# Patient Record
Sex: Female | Born: 1960 | Race: White | Hispanic: No | Marital: Married | Smoking: Former smoker
Health system: Southern US, Community
[De-identification: ages and names within clinical notes are randomized; demographics above are authoritative.]

## PROBLEM LIST (undated history)

## (undated) DIAGNOSIS — R112 Nausea with vomiting, unspecified: Secondary | ICD-10-CM

## (undated) DIAGNOSIS — Z87891 Personal history of nicotine dependence: Secondary | ICD-10-CM

## (undated) DIAGNOSIS — I10 Essential (primary) hypertension: Secondary | ICD-10-CM

## (undated) DIAGNOSIS — G5602 Carpal tunnel syndrome, left upper limb: Secondary | ICD-10-CM

## (undated) HISTORY — PX: APPENDECTOMY: SHX54

## (undated) HISTORY — DX: Essential (primary) hypertension: I10

## (undated) HISTORY — PX: FOOT SURGERY: SHX648

## (undated) HISTORY — PX: TUBAL LIGATION: SHX77

---

## 2002-04-07 ENCOUNTER — Encounter: Payer: Self-pay | Admitting: Internal Medicine

## 2002-04-07 ENCOUNTER — Ambulatory Visit (HOSPITAL_COMMUNITY): Admission: RE | Admit: 2002-04-07 | Discharge: 2002-04-07 | Payer: Self-pay | Admitting: Internal Medicine

## 2002-09-01 ENCOUNTER — Ambulatory Visit (HOSPITAL_COMMUNITY): Admission: RE | Admit: 2002-09-01 | Discharge: 2002-09-01 | Payer: Self-pay | Admitting: Internal Medicine

## 2002-09-01 ENCOUNTER — Encounter: Payer: Self-pay | Admitting: Internal Medicine

## 2002-09-07 ENCOUNTER — Encounter (HOSPITAL_COMMUNITY): Admission: RE | Admit: 2002-09-07 | Discharge: 2002-10-07 | Payer: Self-pay | Admitting: Internal Medicine

## 2002-09-07 ENCOUNTER — Encounter: Payer: Self-pay | Admitting: Internal Medicine

## 2002-10-15 ENCOUNTER — Encounter: Payer: Self-pay | Admitting: Internal Medicine

## 2002-10-15 ENCOUNTER — Ambulatory Visit (HOSPITAL_COMMUNITY): Admission: RE | Admit: 2002-10-15 | Discharge: 2002-10-15 | Payer: Self-pay | Admitting: Internal Medicine

## 2003-10-14 ENCOUNTER — Ambulatory Visit (HOSPITAL_COMMUNITY): Admission: RE | Admit: 2003-10-14 | Discharge: 2003-10-14 | Payer: Self-pay | Admitting: Obstetrics & Gynecology

## 2003-12-31 ENCOUNTER — Ambulatory Visit (HOSPITAL_COMMUNITY): Admission: RE | Admit: 2003-12-31 | Discharge: 2003-12-31 | Payer: Self-pay | Admitting: Family Medicine

## 2003-12-31 ENCOUNTER — Ambulatory Visit (HOSPITAL_COMMUNITY): Admission: RE | Admit: 2003-12-31 | Discharge: 2003-12-31 | Payer: Self-pay | Admitting: Internal Medicine

## 2006-11-13 ENCOUNTER — Ambulatory Visit (HOSPITAL_COMMUNITY): Admission: RE | Admit: 2006-11-13 | Discharge: 2006-11-13 | Payer: Self-pay | Admitting: Family Medicine

## 2006-11-14 ENCOUNTER — Ambulatory Visit (HOSPITAL_COMMUNITY): Admission: RE | Admit: 2006-11-14 | Discharge: 2006-11-14 | Payer: Self-pay | Admitting: Family Medicine

## 2006-11-19 ENCOUNTER — Inpatient Hospital Stay (HOSPITAL_COMMUNITY): Admission: AD | Admit: 2006-11-19 | Discharge: 2006-11-27 | Payer: Self-pay | Admitting: Gastroenterology

## 2006-11-21 ENCOUNTER — Encounter (INDEPENDENT_AMBULATORY_CARE_PROVIDER_SITE_OTHER): Payer: Self-pay | Admitting: Gastroenterology

## 2007-08-30 ENCOUNTER — Emergency Department (HOSPITAL_COMMUNITY): Admission: EM | Admit: 2007-08-30 | Discharge: 2007-08-30 | Payer: Self-pay | Admitting: Family Medicine

## 2008-02-20 ENCOUNTER — Ambulatory Visit (HOSPITAL_COMMUNITY): Admission: RE | Admit: 2008-02-20 | Discharge: 2008-02-20 | Payer: Self-pay | Admitting: Family Medicine

## 2010-10-24 NOTE — Consult Note (Signed)
NAMENIMCO, BIVENS NO.:  [id number]   MEDICAL RECORD NO.:  [id number]          PATIENT TYPE:  INP   LOCATION:  5707                         FACILITY:  [facility]   PHYSICIAN:  Bernette Redbird, M.D.   DATE OF BIRTH:  1960-07-31   DATE OF CONSULTATION:  11/19/2006  DATE OF DISCHARGE:                                 CONSULTATION   HISTORY OF PRESENT ILLNESS:  This is a 50 year old female with a history  of hypertension, complaining of right upper quadrant pain that began  approximately 9 days ago.  She states that it radiates to the back.  She  complains of upper abdominal distention and increased eructation of gas,  as well as possible constipation from June 3 to June 7, one episode of  diarrhea on June 7 and no bowel movement since.  She has had a large  amount of vomiting Saturday and Sunday with an episode that lasted  approximately 3 hours.  During that episode, there was some hematemesis;  however, she has not vomited since and complains that she has been  unable to keep down any oral intake for the past 2 days.  No hematemesis  has been seen.  She is positive for irritable bowel syndrome symptoms  for years, alternating diarrhea and constipation.   PAST MEDICAL HISTORY:  Significant for:  1. Hypertension.  2. C-section.  3. Foot surgery.  4. D&C with endometrial ablation.  5. She has never had a colonoscopy or endoscopy.  6. She had an appendectomy in 1978.  7. Bilateral tubal ligation in 1995.  8. She has a history of a gallbladder attack, required hospitalization      approximately 5 years ago, per the patient.  Also, she had an      episode, when she was 50 years old, of inflammation in her small      intestine that caused a 1 week hospitalization.   CURRENT MEDICATIONS:  Include:  1. Benicar 20/12.5.  2. Ambien.  3. Prilosec, which was just started this past week.   REVIEW OF SYSTEMS:  Significant for history of present illness.  No  weight loss or  sick contacts.   FAMILY HISTORY:  Significant for bowel disease.  No colon cancer.  Her  mother at 62 years old with congestive heart failure.  Her father passed  away at 52 years old with lung cancer.   SOCIAL HISTORY:  Positive for tobacco, 1 pack per day.  Negative for  alcohol or drugs and no alcohol for the past 13 years.   PHYSICAL EXAMINATION:  GENERAL:  She is alert and oriented in no  apparent distress.  VITAL SIGNS:  Temperature is 97.6.  Pulse is 90.  Respirations are 18.  Blood pressure is 154/70.  HEART:  Has a regular rate and rhythm.  LUNGS:  Clear to auscultation.  ABDOMEN:  Soft, nondistended with quiet bowel sounds.  Tender to  palpation in the right upper quadrant.   On ultrasound, June 5, done at [facility], she had no  gallstones.  No pericholecystic fluid or gallbladder wall thickening.  Common bile duct  was mildly dilated at 8 mm.   ASSESSMENT:  Dr. Molly Maduro Buccini is admitting the patient.  He has seen  and examined her, collected the history.  He states that the patient has  had several days of recurring paroxysms of upper abdominal pain and  distention, partially relieved by recurrence of vomiting, which is  sometimes sporadic.  No long term symptoms.  She has had 1 episode of  diarrhea 2 days ago.  Her exam is benign.  Labs are pending, but LFTs  were normal 2 days ago at her primary care physician, Dr. Sharyon Medicus  office.  Ultrasound, at [facility], showed no gallstones.  An 8 mm  common bile duct.   IMPRESSION:  Unclear picture.  Differential diagnoses includes  gastroenteritis, a calculus biliary tract disease, intermittent small  bowel obstruction, questionable peptic ulcer disease, gout, common bile  duct pathology given normal LFTs.   PLAN:  Draw labs.  Recheck ultrasound.  CPK with HIDA scan in the  morning.  Pending those results, possibly an EGD or a CAT scan of her  abdomen and pelvis.  For now, we will give antinausea therapy and   analgesics.      Stephani Police, PA    ______________________________  Bernette Redbird, M.D.    MLY/MEDQ  D:  11/19/2006  T:  11/20/2006  Job:  [id number]   cc:   Madelin Rear. Sherwood Gambler, MD

## 2010-10-24 NOTE — Op Note (Signed)
Joy Alvarado, Joy Alvarado                 ACCOUNT NO.:  [id number]   MEDICAL RECORD NO.:  [id number]          PATIENT TYPE:  INP   LOCATION:  5707                         FACILITY:  [facility]   PHYSICIAN:  Bernette Redbird, M.D.   DATE OF BIRTH:  January 24, 1961   DATE OF PROCEDURE:  11/21/2006  DATE OF DISCHARGE:                               OPERATIVE REPORT   PROCEDURE:  Upper endoscopy with biopsies.   ENDOSCOPIST:  Bernette Redbird, M.D.   INDICATIONS:  [age]-year-old with a several-day history of upper  abdominal pain, nausea and vomiting with negative ultrasound, CCK  hepatobiliary scan, and abdominal CT.   FINDINGS:  Normal exam other than small hiatal hernia.   PROCEDURE:  The nature, purpose and risks of the procedure had been  discussed with the patient, who provided written consent.  She was  brought in a fasted state from her hospital room to the endoscopy unit,  where she was sedated with fentanyl 75 mcg and Versed 5 mg IV, without  arrhythmias or desaturation.  The Pentax video endoscope was passed  under direct vision.  The larynx looked grossly normal.  The esophagus  was also normal and was quite easily entered.  The mucosa was normal,  there was no evidence of free reflux, reflux esophagitis, Barrett's  esophagus, varices, infection, neoplasia, or any ring or stricture.  A  small hiatal hernia was present.  The stomach contained no significant  residual and had normal mucosa without evidence of gastritis, erosions,  ulcers, polyps or masses including retroflexed view of the cardia, and  the pylorus, duodenal bulb and second duodenum looked completely normal.  Random mucosal biopsies were obtained of the duodenum and gastric  antrum, in view of the unexplained symptoms, and the scope was then  removed from the patient, who tolerated the procedure well and without  apparent complication.   IMPRESSION:  Right upper quadrant abdominal pain without obvious  explanation on  current examination (789.01).   PLAN:  Await pathology results.           ______________________________  Bernette Redbird, M.D.     RB/MEDQ  D:  11/21/2006  T:  11/21/2006  Job:  [id number]   cc:   Madelin Rear. Sherwood Gambler, MD

## 2010-10-24 NOTE — Discharge Summary (Signed)
NAMEAUDRIE, Joy Alvarado                 ACCOUNT NO.:  [id number]   MEDICAL RECORD NO.:  [id number]          PATIENT TYPE:  INP   LOCATION:  5707                         FACILITY:  [facility]   PHYSICIAN:  Graylin Shiver, M.D.   DATE OF BIRTH:  14-Jan-1961   DATE OF ADMISSION:  11/19/2006  DATE OF DISCHARGE:  11/27/2006                               DISCHARGE SUMMARY   ADMISSION DIAGNOSES:  1. Recurrent vomiting.  2. Constipation.  3. Hypertension.   DISCHARGE DIAGNOSES:  1. Idiopathic gastroparesis.  2. Irritable bowel syndrome.  3. Hypertension.   SERVICE:  Gastroenterology.   CONSULTANT:  None.   PROCEDURES:  November 21, 2006, upper endoscopy with biopsies by Dr. Bernette Redbird.   DIAGNOSTICS:  1. Abdominal ultrasound June 10 showed:      a.     Gallbladder polyps.      b.     Prominent common bile duct at 7 mm.      c.     Probable mild fatty infiltration of the liver.  2. Diagnostic nuclear medicine hepatobiliary scan with ejection      fraction.  Impression was normal examination gallbladder ejection      fraction of 56%.  3. Diagnostic CT scan of the abdomen and pelvis.  Impression was:      a.     Constipation      b.     Right ovarian cyst.  4. Diagnostic upper GI series with small-bowel follow-through.      Impression was:      a.     Negative negative small bowel follow-through.      b.     Small hiatal hernia.      c.     No esophageal reflex was elicited on examination, esophageal       motility is normal.  5. June 17, a two-view abdominal x-ray.  Impression:  Distal colonic      disease is considered.  This x-ray notes that an air column within      the distal sigmoid is narrowed as was seen on previous CT scan.  6. Diagnostic nuclear medicine gastric emptying study.  Impression:      Delayed gastric emptying at 2 hours.  There is 85% remaining      activity in the stomach, normally there is less than 30%   BRIEF HISTORY:  This is a 50 year old female with history  of  hypertension who was complaining of right upper quadrant pain that began  approximately 9 days prior to admission.  She said that it did radiate  to her back.  She complains of upper abdominal distension and increased  eructation as well as possible constipation intermixed with episodes of  diarrhea.  She had a large amount of vomiting prior to admission.  There  was a minimal amount of hematemesis, and she reported that she was  unable to keep any oral intake down for 2 days prior to admission.  She  states that she has had alternating constipation and diarrhea for  several years.   PAST MEDICAL  HISTORY:  1. Hypertension.  2. C section  3. Foot surgery.  4. D&C with endometrial ablation.  5. Appendectomy in 1978.  6. Bilateral tubal ligation in 1995.  7. She states she has had a history of a gallbladder attack that      required hospitalization approximately 5 years ago.  8. She states that when she was 50 years old, she had an episode of      inflammation of her small intestine the caused a one week      hospitalization.  The patient reports that she has never had a      colonoscopy.   HOSPITAL COURSE:  The patient was admitted, placed on clear liquid diet.  Labs were checked.  She had a complete __________ with no villous  atrophy inflammation or other abnormalities.  She did have a full  gastroenterological workup which included an EGD.  The EGD results were  essentially normal.  Pathology from the EGD was positive for H pylori  for which the patient was treated.  She was also found to have a  positive gastric emptying scan with 85% retention of contents after 2  hours.  On the day of discharge, the patient was in good condition, able  to tolerate clear liquids without vomiting.  She was requesting to go  home on physical exam.  She was alert and oriented and in no apparent  distress.  She was not jaundiced, showed no signs of icterus or pallor.  Her abdomen was soft,  nontender, nondistended with good bowel sounds.  The patient was discharged to home at her request.   DISCHARGE MEDICATIONS:  1. Benicar HCT 20/12.51 daily.  2. Prevpac b.i.d. times 10 days.  3. Metoclopramide 10 mg p.o. 1/2 hour before meals and at bedtime.  4. Potassium 10 mEq one a day for 7 days.  5. The patient was already on Prilosec at home.  She will continue      that.   FOLLOWUP:  We scheduled a follow-up appointment for her to see Dr.  Matthias Hughs on June 25 at 2:45 p.m. Items to be addressed in the follow-up  appointments include:  1. Gastroparesis and recurrent vomiting.  2. Symptoms of gastroesophageal reflux disease.  3. Symptoms of constipation/diarrhea.  4. Possible colonic abnormalities seen on x-ray during her hospital      stay.   DISCHARGE INSTRUCTIONS:  1. Clear liquid diet until symptoms improve.  2. Ensure supplements.  3. Please call our office if symptoms worsen  4. Gastroparesis diet including small meals, the elimination of      cellulose.      Joy Police, PA    ______________________________  Graylin Shiver, M.D.    MLY/MEDQ  D:  11/27/2006  T:  11/28/2006  Job:  [id number]   cc:   Madelin Rear. Sherwood Gambler, MD  Bernette Redbird, M.D.

## 2010-10-27 NOTE — Op Note (Signed)
NAME:  Joy Alvarado, HANNAN                           ACCOUNT NO.:  [id number]   MEDICAL RECORD NO.:  [id number]                   PATIENT TYPE:  AMB   LOCATION:  DAY                                  FACILITY:  [facility]   PHYSICIAN:  Lazaro Arms, M.D.                DATE OF BIRTH:  04-Jul-1960   DATE OF PROCEDURE:  10/14/2003  DATE OF DISCHARGE:  10/14/2003                                 OPERATIVE REPORT   PREOPERATIVE DIAGNOSES:  1. Menometrorrhagia.  2. Dysmenorrhea.  3. Dysfunctional uterine bleeding.   POSTOPERATIVE DIAGNOSES:  1. Menometrorrhagia.  2. Dysmenorrhea.  3. Dysfunctional uterine bleeding.  4. Submucosal myoma.   SURGEON:  Lazaro Arms, M.D.   ANESTHESIA:  General endotracheal.   FINDINGS:  The patient had a normal endometrium and a small rubbery area  consistent with a very small myoma.  Endometrial curettings were all removed  and sent to pathology.  There was minimal bleeding.   DESCRIPTION OF PROCEDURE:  The patient was taken to the operating room and  placed in the supine position where she underwent general endotracheal  anesthesia.  She was placed in the dorsal lithotomy position and prepped and  draped in the usual sterile fashion.  The bladder was drained.  The cervix  was grasped and dilated serially to allow passage of the hysteroscope.  Hysteroscopy was performed using normal saline as the distending media.  The  patient tolerated the procedure well.  At this point, there was a normal  endometrium, as stated above.  Uterine curettage was performed, with all  fragments sent to pathology.  Endometrial ablation was then performed using  D5W as the distending media to obtain an adequate pressure.  It was heated  to 87 degrees Celsius for a total therapy time of 10 and a half minutes.  The patient tolerated the procedure well.  All fluid was recovered at the  end of the procedure.  She was awakened from anesthesia and taken to the  recovery room doing  well.      ___________________________________________                                            Lazaro Arms, M.D.   LHE/MEDQ  D:  10/26/2003  T:  10/26/2003  Job:  [id number]

## 2010-10-27 NOTE — H&P (Signed)
NAME:  MIKAIAH, STOFFER                           ACCOUNT NO.:  [id number]   MEDICAL RECORD NO.:  [id number]                   PATIENT TYPE:  AMB   LOCATION:  DAY                                  FACILITY:  [facility]   PHYSICIAN:  Lazaro Arms, M.D.                DATE OF BIRTH:  [birth year]   DATE OF ADMISSION:  DATE OF DISCHARGE:                                HISTORY & PHYSICAL   HISTORY OF PRESENT ILLNESS:  Joy Alvarado is a 50 year old white female, gravida  3, para 3, who is status post tubal ligation in 1995 and presents with a  relatively long history of heavy vaginal bleeding monthly.  She started her  last period on March 28th and bleed through September 29, 2003.  Her bleeding at  the time was quite heavy with heavy cramping.  Her hemoglobin in the office  was 12.1.  Examination in the office revealed normal-sized uterus.  As a  result because of her prolonged persistent vaginal bleeding she is admitted  for definitive therapy and endometrial ablation.  She was placed on Megace  in the interim, which has stopped her bleeding.   PAST MEDICAL HISTORY:  The patient has hypertension.   PAST SURGICAL HISTORY:  1. The patient had an appendectomy in 1978.  2. Tubal ligation in 1995.   PAST OBSTETRICAL HISTORY:  Three vaginal deliveries.   MEDICATIONS:  Diovan.   ALLERGIES:  None.   REVIEW OF SYSTEMS:  Review of systems is otherwise negative.   SOCIAL HISTORY:  The patient does smoke cigarettes.  She does do drugs or  drink.   PHYSICAL EXAMINATION:  VITAL SIGNS:  Weight is 176 pounds.  Blood pressure  120/90.  HEENT AND NECK:  The HEENT is unremarkable and thyroid is normal.  LUNGS:  Lungs are clear.  HEART:  Heart is regular rate and rhythm without murmur, regurg or gallop.  BREASTS:  Breasts are without discharge, mass or skin changes.  ABDOMEN:  Abdomen is benign with no hepatosplenomegaly or masses.  PELVIC EXAMINATION:  The patient has normal external genitalia.  The vagina  is  pink, moist  with no discharge.  The cervix is parous without lesion.  The uterus is normal size and nontender.  The adnexa is nontender.  EXTREMITIES:  Extremities are warm without edema.  NEUROLOGIC:  Neurological exam is grossly intact.   IMPRESSION:  1. Menometrorrhagia.  2. Dysmenorrhea.  3. Dysfunction uterine bleeding.   PLAN:  The patient is admitted for hysteroscopy and endometrial ablation.  She understands the risks, benefits, indication, and alternatives and will  proceed.     ___________________________________________                                         Lazaro Arms, M.D.   Loraine Maple  D:  10/13/2003  T:  10/14/2003  Job:  [id number]

## 2011-03-05 LAB — POCT URINALYSIS DIP (DEVICE)
Glucose, UA: NEGATIVE
Hgb urine dipstick: NEGATIVE
Nitrite: NEGATIVE
Operator id: 239701
Protein, ur: 30 — AB
Specific Gravity, Urine: 1.02
Urobilinogen, UA: 1
pH: 6.5

## 2011-03-28 LAB — CBC
HCT: 36.6
Hemoglobin: 12.6
MCHC: 34.5
MCV: 87.2
Platelets: 180
RBC: 4.19
RDW: 12.8
WBC: 6.8

## 2011-03-28 LAB — COMPREHENSIVE METABOLIC PANEL
ALT: 11
AST: 13
Albumin: 3.1 — ABNORMAL LOW
Alkaline Phosphatase: 62
BUN: 3 — ABNORMAL LOW
CO2: 24
Calcium: 8.4
Chloride: 108
Creatinine, Ser: 0.71
GFR calc Af Amer: >60
GFR calc non Af Amer: >60
Glucose, Bld: 104 — ABNORMAL HIGH
Potassium: 3.4 — ABNORMAL LOW
Sodium: 139
Total Bilirubin: 0.5
Total Protein: 5.5 — ABNORMAL LOW

## 2011-03-28 LAB — AMYLASE: Amylase: 33

## 2011-03-29 LAB — CBC
HCT: 40.2
HCT: 41.7
Hemoglobin: 13.9
Hemoglobin: 14.5
MCHC: 34.6
MCHC: 34.7
MCV: 88.4
MCV: 88.9
Platelets: 197
Platelets: 212
RBC: 4.52
RBC: 4.72
RDW: 12.8
RDW: 13
WBC: 10.6 — ABNORMAL HIGH
WBC: 7.1

## 2011-03-29 LAB — COMPREHENSIVE METABOLIC PANEL
ALT: 12
ALT: 13
AST: 12
AST: 13
Albumin: 3.3 — ABNORMAL LOW
Albumin: 3.8
Alkaline Phosphatase: 66
Alkaline Phosphatase: 67
BUN: 6
BUN: 7
CO2: 26
CO2: 27
Calcium: 8.5
Calcium: 9.1
Chloride: 105
Chloride: 108
Creatinine, Ser: 0.7
Creatinine, Ser: 0.74
GFR calc Af Amer: >60
GFR calc Af Amer: >60
GFR calc non Af Amer: >60
GFR calc non Af Amer: >60
Glucose, Bld: 92
Glucose, Bld: 94
Potassium: 3.6
Potassium: 3.9
Sodium: 138
Sodium: 138
Total Bilirubin: 0.4
Total Bilirubin: 0.7
Total Protein: 5.9 — ABNORMAL LOW
Total Protein: 6.6

## 2011-03-29 LAB — BASIC METABOLIC PANEL
BUN: 5 — ABNORMAL LOW
CO2: 27
Calcium: 8.8
Chloride: 105
Creatinine, Ser: 0.71
GFR calc Af Amer: >60
GFR calc non Af Amer: >60
Glucose, Bld: 101 — ABNORMAL HIGH
Potassium: 3.3 — ABNORMAL LOW
Sodium: 138

## 2011-03-29 LAB — LIPASE, BLOOD: Lipase: 19

## 2011-03-29 LAB — AMYLASE: Amylase: 45

## 2011-04-20 ENCOUNTER — Other Ambulatory Visit (HOSPITAL_COMMUNITY): Payer: Self-pay | Admitting: Internal Medicine

## 2011-04-20 DIAGNOSIS — Z139 Encounter for screening, unspecified: Secondary | ICD-10-CM

## 2011-04-26 ENCOUNTER — Ambulatory Visit (HOSPITAL_COMMUNITY)
Admission: RE | Admit: 2011-04-26 | Discharge: 2011-04-26 | Disposition: A | Discharge status: Home / Self Care | Payer: BC Managed Care – PPO | Source: Ambulatory Visit | Attending: Internal Medicine | Admitting: Internal Medicine

## 2011-04-26 DIAGNOSIS — Z1231 Encounter for screening mammogram for malignant neoplasm of breast: Secondary | ICD-10-CM | POA: Insufficient documentation

## 2011-04-26 DIAGNOSIS — Z139 Encounter for screening, unspecified: Secondary | ICD-10-CM

## 2014-02-03 ENCOUNTER — Other Ambulatory Visit (HOSPITAL_COMMUNITY): Payer: Self-pay | Admitting: Internal Medicine

## 2014-02-03 DIAGNOSIS — Z139 Encounter for screening, unspecified: Secondary | ICD-10-CM

## 2014-02-08 ENCOUNTER — Ambulatory Visit (HOSPITAL_COMMUNITY)
Admission: RE | Admit: 2014-02-08 | Discharge: 2014-02-08 | Disposition: A | Discharge status: Home / Self Care | Payer: PRIVATE HEALTH INSURANCE | Source: Ambulatory Visit | Attending: Internal Medicine | Admitting: Internal Medicine

## 2014-02-08 DIAGNOSIS — Z139 Encounter for screening, unspecified: Secondary | ICD-10-CM | POA: Diagnosis not present

## 2015-04-18 ENCOUNTER — Encounter: Payer: Self-pay | Admitting: Podiatry

## 2015-04-18 ENCOUNTER — Ambulatory Visit (INDEPENDENT_AMBULATORY_CARE_PROVIDER_SITE_OTHER): Payer: BLUE CROSS/BLUE SHIELD

## 2015-04-18 ENCOUNTER — Ambulatory Visit (INDEPENDENT_AMBULATORY_CARE_PROVIDER_SITE_OTHER): Payer: BLUE CROSS/BLUE SHIELD | Admitting: Podiatry

## 2015-04-18 VITALS — BP 157/101 | HR 79 | Resp 12

## 2015-04-18 DIAGNOSIS — R52 Pain, unspecified: Secondary | ICD-10-CM | POA: Diagnosis not present

## 2015-04-18 DIAGNOSIS — Q828 Other specified congenital malformations of skin: Secondary | ICD-10-CM | POA: Diagnosis not present

## 2015-04-18 DIAGNOSIS — L84 Corns and callosities: Secondary | ICD-10-CM

## 2015-04-18 NOTE — Progress Notes (Signed)
   Subjective:    Patient ID: Joy Alvarado, female    DOB: 1961-02-19, 54 y.o.   MRN: [id number]  HPIThis patient presents to the office with painful skin lesions on both feet.  She says she has a new job which she believes has led to callus on big toe left foot.  She has tried padding but problematic callus continues.  She also has painful skin lesion under the ball of her right foot.  Both problems have continued to worsen.  She has previously had surgery for removal 1,2,3 toenails both feet which have healed well.  She presents for evaluation and treatment.  B/L ball of feet pain  Review of Systems  All other systems reviewed and are negative.      Objective:   Physical Exam GENERAL APPEARANCE: Alert, conversant. Appropriately groomed. No acute distress.  VASCULAR: Pedal pulses palpable at  [facility] and PT bilateral.  Capillary refill time is immediate to all digits,  Normal temperature gradient.  Digital hair growth is present bilateral  NEUROLOGIC: sensation is normal to 5.07 monofilament at 5/5 sites bilateral.  Light touch is intact bilateral, Muscle strength normal.  MUSCULOSKELETAL: acceptable muscle strength, tone and stability bilateral.  Intrinsic muscluature intact bilateral.  Rectus appearance of foot and digits noted bilateral. Significant guarding IPJ left hallux at IPJ left hallux.  DERMATOLOGIC: skin color, texture, and turgor are within normal limits.  No preulcerative lesions or ulcers  are seen, no interdigital maceration noted.  No open lesions present.  Digital nails are asymptomatic 4,5 B/L.  Porokeratosis sub 1 right foot.  Diffuse plantar tyloma under IPJ left foot  Hallux..         Assessment & Plan:  Porokeratosis right foot  Callus left hallux.  ROV.  Discussed conservative vs. Surgical treatment right foot.  X-ray reveal no pathology.  Told her her callus is job related and all I can offer her is padding.  To acquire padding for her hallux.

## 2015-04-21 ENCOUNTER — Ambulatory Visit (INDEPENDENT_AMBULATORY_CARE_PROVIDER_SITE_OTHER): Payer: BLUE CROSS/BLUE SHIELD | Admitting: Podiatry

## 2015-04-21 ENCOUNTER — Encounter: Payer: Self-pay | Admitting: Podiatry

## 2015-04-21 DIAGNOSIS — Q828 Other specified congenital malformations of skin: Secondary | ICD-10-CM

## 2015-04-21 DIAGNOSIS — M2580 Other specified joint disorders, unspecified joint: Secondary | ICD-10-CM

## 2015-04-21 NOTE — Progress Notes (Signed)
Subjective:     Patient ID: Joy Alvarado, female   DOB: [birth year], 54 y.o.   MRN: [id number]  HPI this patient presents to the office with chief complaint of painful calluses, especially under the ball of her right foot. She was seen and asked for these calluses which were then debrided. She states since that visit, right foot has been in intense pain and she desires to have the surgery for the removal of skin lesion that was discussed at that Monday visit. She presents to the office for removal of skin lesion under the plate toe joint of the right foot. She appears to have pain out of proportion to what is seen clinically under the big toe joint of the right foot   Review of Systems     Objective:   Physical Exam Physical Exam GENERAL APPEARANCE: Alert, conversant. Appropriately groomed. No acute distress.  VASCULAR: Pedal pulses palpable at [facility] and PT bilateral. Capillary refill time is immediate to all digits, Normal temperature gradient. Digital hair growth is present bilateral  NEUROLOGIC: sensation is normal to 5.07 monofilament at 5/5 sites bilateral. Light touch is intact bilateral, Muscle strength normal.  MUSCULOSKELETAL: acceptable muscle strength, tone and stability bilateral. Intrinsic muscluature intact bilateral. Rectus appearance of foot and digits noted bilateral. Significant guarding IPJ left hallux at IPJ left hallux.. Palpable pain fibular sasamoid right foot.  DERMATOLOGIC: skin color, texture, and turgor are within normal limits. No preulcerative lesions or ulcers are seen, no interdigital maceration noted. No open lesions present. Digital nails are asymptomatic 4,5 B/L. Porokeratosis sub 1 right foot. Diffuse plantar tyloma under IPJ left foot Hallux     Assessment:     Porokeratosis Right Foot   Sesamoiditis right foot.     Plan:     ROV  Excision of skin lesion.This patient was anesthetized with mixture of 2% lidocaine plain and 2 % lidocaine with epi.  at the site of the skin lesion.  The surgical site was then washed with betadine and alcohol.  Using a punch the lesion was excised and passed off as specimen.  The surgical site was cauterized with phenol and washed with alcohol.  The site was bandaged with neosporin, sterile 2x2 and kling.  Home instructions given.  She was infiltrated with cortisone fibular sesamoid right foot. RTC 5 days.  Gardiner Barefoot DPM

## 2015-04-25 ENCOUNTER — Encounter: Payer: Self-pay | Admitting: Podiatry

## 2015-04-25 ENCOUNTER — Ambulatory Visit (INDEPENDENT_AMBULATORY_CARE_PROVIDER_SITE_OTHER): Payer: BLUE CROSS/BLUE SHIELD | Admitting: Podiatry

## 2015-04-25 VITALS — BP 128/66 | HR 60 | Resp 12

## 2015-04-25 DIAGNOSIS — Z09 Encounter for follow-up examination after completed treatment for conditions other than malignant neoplasm: Secondary | ICD-10-CM

## 2015-04-25 NOTE — Progress Notes (Signed)
Subjective:     Patient ID: Joy Alvarado, female   DOB: Oct 19, 1960, 53 y.o.   MRN: [id number]  HPIThis patient presents tom the office with continued pain 5 days after excision porokeratosis right foot.  There is no evidence of redness or swelling.  She has difficulty walking on her foot.   Review of Systems     Objective:   Physical Exam GENERAL APPEARANCE: Alert, conversant. Appropriately groomed. No acute distress.  VASCULAR: Pedal pulses palpable at  [facility] and PT bilateral.  Capillary refill time is immediate to all digits,  Normal temperature gradient.  Digital hair growth is present bilateral  NEUROLOGIC: sensation is normal to 5.07 monofilament at 5/5 sites bilateral.  Light touch is intact bilateral, Muscle strength normal.  MUSCULOSKELETAL: acceptable muscle strength, tone and stability bilateral.  Intrinsic muscluature intact bilateral.  Rectus appearance of foot and digits noted bilateral.   DERMATOLOGIC: skin color, texture, and turgor are within normal limits.  No preulcerative lesions or ulcers  are seen, no interdigital maceration noted.  No open lesions present.  Digital nails are asymptomatic. No drainage noted. Porokeratosis is healing with no redness swelling or infection.      Assessment:     S/p skin surgery     Plan:     ROV  Dispersion padding applied.  RTC prn   Gardiner Barefoot DPM

## 2015-04-27 DIAGNOSIS — M79673 Pain in unspecified foot: Secondary | ICD-10-CM

## 2015-05-12 ENCOUNTER — Telehealth: Payer: Self-pay | Admitting: *Deleted

## 2015-05-12 NOTE — Telephone Encounter (Signed)
[facility] lab form sent to our office without type of specimen or location, dx or doctor signature.  I faxed to the [facility]  site for clarification.

## 2015-05-23 ENCOUNTER — Encounter: Payer: Self-pay | Admitting: Podiatry

## 2016-04-09 DIAGNOSIS — E663 Overweight: Secondary | ICD-10-CM | POA: Diagnosis not present

## 2016-04-09 DIAGNOSIS — Z23 Encounter for immunization: Secondary | ICD-10-CM | POA: Diagnosis not present

## 2016-04-09 DIAGNOSIS — I10 Essential (primary) hypertension: Secondary | ICD-10-CM | POA: Diagnosis not present

## 2016-04-09 DIAGNOSIS — Z6827 Body mass index (BMI) 27.0-27.9, adult: Secondary | ICD-10-CM | POA: Diagnosis not present

## 2016-04-09 DIAGNOSIS — G894 Chronic pain syndrome: Secondary | ICD-10-CM | POA: Diagnosis not present

## 2016-04-09 DIAGNOSIS — Z1389 Encounter for screening for other disorder: Secondary | ICD-10-CM | POA: Diagnosis not present

## 2016-05-28 DIAGNOSIS — I10 Essential (primary) hypertension: Secondary | ICD-10-CM | POA: Diagnosis not present

## 2016-05-28 DIAGNOSIS — Z6827 Body mass index (BMI) 27.0-27.9, adult: Secondary | ICD-10-CM | POA: Diagnosis not present

## 2016-05-28 DIAGNOSIS — E663 Overweight: Secondary | ICD-10-CM | POA: Diagnosis not present

## 2016-05-28 DIAGNOSIS — Z0001 Encounter for general adult medical examination with abnormal findings: Secondary | ICD-10-CM | POA: Diagnosis not present

## 2016-05-28 DIAGNOSIS — Z1389 Encounter for screening for other disorder: Secondary | ICD-10-CM | POA: Diagnosis not present

## 2016-05-28 DIAGNOSIS — G894 Chronic pain syndrome: Secondary | ICD-10-CM | POA: Diagnosis not present

## 2016-05-28 DIAGNOSIS — G2581 Restless legs syndrome: Secondary | ICD-10-CM | POA: Diagnosis not present

## 2016-05-29 ENCOUNTER — Other Ambulatory Visit (HOSPITAL_COMMUNITY): Payer: Self-pay | Admitting: Internal Medicine

## 2016-05-29 DIAGNOSIS — Z1231 Encounter for screening mammogram for malignant neoplasm of breast: Secondary | ICD-10-CM

## 2016-06-14 ENCOUNTER — Ambulatory Visit (HOSPITAL_COMMUNITY)
Admission: RE | Admit: 2016-06-14 | Discharge: 2016-06-14 | Disposition: A | Discharge status: Home / Self Care | Payer: BLUE CROSS/BLUE SHIELD | Source: Ambulatory Visit | Attending: Internal Medicine | Admitting: Internal Medicine

## 2016-06-14 DIAGNOSIS — Z1231 Encounter for screening mammogram for malignant neoplasm of breast: Secondary | ICD-10-CM | POA: Insufficient documentation

## 2016-11-09 ENCOUNTER — Emergency Department (HOSPITAL_COMMUNITY): Payer: BLUE CROSS/BLUE SHIELD

## 2016-11-09 ENCOUNTER — Encounter (HOSPITAL_COMMUNITY): Payer: Self-pay

## 2016-11-09 ENCOUNTER — Emergency Department (HOSPITAL_COMMUNITY)
Admission: EM | Admit: 2016-11-09 | Discharge: 2016-11-09 | Disposition: A | Discharge status: Home / Self Care | Payer: BLUE CROSS/BLUE SHIELD | Attending: Emergency Medicine | Admitting: Emergency Medicine

## 2016-11-09 DIAGNOSIS — R42 Dizziness and giddiness: Secondary | ICD-10-CM | POA: Diagnosis not present

## 2016-11-09 DIAGNOSIS — R55 Syncope and collapse: Secondary | ICD-10-CM | POA: Diagnosis not present

## 2016-11-09 DIAGNOSIS — R9431 Abnormal electrocardiogram [ECG] [EKG]: Secondary | ICD-10-CM | POA: Insufficient documentation

## 2016-11-09 DIAGNOSIS — F17210 Nicotine dependence, cigarettes, uncomplicated: Secondary | ICD-10-CM | POA: Diagnosis not present

## 2016-11-09 DIAGNOSIS — Z1389 Encounter for screening for other disorder: Secondary | ICD-10-CM | POA: Diagnosis not present

## 2016-11-09 DIAGNOSIS — Z6826 Body mass index (BMI) 26.0-26.9, adult: Secondary | ICD-10-CM | POA: Diagnosis not present

## 2016-11-09 DIAGNOSIS — Z79899 Other long term (current) drug therapy: Secondary | ICD-10-CM | POA: Diagnosis not present

## 2016-11-09 DIAGNOSIS — I10 Essential (primary) hypertension: Secondary | ICD-10-CM | POA: Insufficient documentation

## 2016-11-09 DIAGNOSIS — I951 Orthostatic hypotension: Secondary | ICD-10-CM | POA: Diagnosis not present

## 2016-11-09 LAB — URINALYSIS, ROUTINE W REFLEX MICROSCOPIC
Bilirubin Urine: NEGATIVE
Glucose, UA: NEGATIVE mg/dL
Ketones, ur: NEGATIVE mg/dL
Nitrite: NEGATIVE
Protein, ur: NEGATIVE mg/dL
Specific Gravity, Urine: 1.009 (ref 1.005–1.030)
pH: 7 (ref 5.0–8.0)

## 2016-11-09 LAB — CBC WITH DIFFERENTIAL/PLATELET
Basophils Absolute: 0 10*3/uL (ref 0.0–0.1)
Basophils Relative: 0 %
Eosinophils Absolute: 0.1 10*3/uL (ref 0.0–0.7)
Eosinophils Relative: 2 %
HCT: 40.3 % (ref 36.0–46.0)
Hemoglobin: 13.9 g/dL (ref 12.0–15.0)
Lymphocytes Relative: 46 %
Lymphs Abs: 3.3 10*3/uL (ref 0.7–4.0)
MCH: 30.7 pg (ref 26.0–34.0)
MCHC: 34.5 g/dL (ref 30.0–36.0)
MCV: 89 fL (ref 78.0–100.0)
Monocytes Absolute: 0.7 10*3/uL (ref 0.1–1.0)
Monocytes Relative: 9 %
Neutro Abs: 3.1 10*3/uL (ref 1.7–7.7)
Neutrophils Relative %: 43 %
Platelets: 191 10*3/uL (ref 150–400)
RBC: 4.53 MIL/uL (ref 3.87–5.11)
RDW: 12.7 % (ref 11.5–15.5)
WBC: 7.2 10*3/uL (ref 4.0–10.5)

## 2016-11-09 LAB — COMPREHENSIVE METABOLIC PANEL
ALT: 13 U/L — ABNORMAL LOW (ref 14–54)
AST: 14 U/L — ABNORMAL LOW (ref 15–41)
Albumin: 4 g/dL (ref 3.5–5.0)
Alkaline Phosphatase: 68 U/L (ref 38–126)
Anion gap: 8 (ref 5–15)
BUN: 11 mg/dL (ref 6–20)
CO2: 27 mmol/L (ref 22–32)
Calcium: 8.9 mg/dL (ref 8.9–10.3)
Chloride: 106 mmol/L (ref 101–111)
Creatinine, Ser: 0.68 mg/dL (ref 0.44–1.00)
GFR calc Af Amer: >60 mL/min (ref >60)
GFR calc non Af Amer: >60 mL/min (ref >60)
Glucose, Bld: 95 mg/dL (ref 65–99)
Potassium: 3.7 mmol/L (ref 3.5–5.1)
Sodium: 141 mmol/L (ref 135–145)
Total Bilirubin: 0.4 mg/dL (ref 0.3–1.2)
Total Protein: 7 g/dL (ref 6.5–8.1)

## 2016-11-09 LAB — TROPONIN I: Troponin I: <0.03 ng/mL (ref <0.03)

## 2016-11-09 MED ORDER — BACITRACIN ZINC 500 UNIT/GM EX OINT: 1.0000 "application " | TOPICAL_OINTMENT | Freq: Two times a day (BID) | CUTANEOUS | Status: DC

## 2016-11-09 MED ORDER — IBUPROFEN 400 MG PO TABS: 400.0000 mg | ORAL_TABLET | Freq: Once | ORAL | Status: DC

## 2016-11-09 MED ORDER — ASPIRIN 81 MG PO CHEW
324.0000 mg | CHEWABLE_TABLET | Freq: Once | ORAL | Status: AC
  Administered 2016-11-09: 324 mg via ORAL
  Filled 2016-11-09: qty 4

## 2016-11-09 MED ORDER — AMLODIPINE BESYLATE 5 MG PO TABS
5.0000 mg | ORAL_TABLET | Freq: Once | ORAL | Status: AC
  Administered 2016-11-09: 5 mg via ORAL
  Filled 2016-11-09: qty 1

## 2016-11-09 NOTE — ED Provider Notes (Signed)
[facility] DEPT Provider Note   CSN: [id number] Arrival date & time: 11/09/16  1723     History   Chief Complaint Chief Complaint  Patient presents with  . Dizziness    HPI Joy Alvarado is a 56 y.o. female.  HPI  The patient is a 56 year old female, she has a known history of hypertension for which she has been treated chronically with lisinopril. She went to follow-up with her family doctor today at which time she was found to be hypertensive but because of the symptoms she has been having including dizziness every time she stands and having occasional left arm numbness in the morning when she wakes up she was sent to the emergency department. Additionally she had an abnormal EKG showing ST depression scattered throughout the EKG. There was no old to compare. The patient denies any gastrointestinal symptoms, she has otherwise been doing well. She does not get any chest pain or shortness of breath when she walks, she has no exertional symptoms, no swelling of the legs. She has no changes in vision. She does have this feeling of vertigo every time she stands up especially if she stands up too quickly. That did occur at the office while they were getting the EKG. She was sent over to the emergency department for further evaluation. She reports that her maximum blood pressures at home and been around 809 systolic scattered over the last month but states usually she has very good control over her blood pressure. She was recently discontinued off of lisinopril and hydrochlorothiazide secondary to muscle cramping and today she was given a prescription for lisinopril and amlodipine as to independent medications.  Past Medical History:  Diagnosis Date  . Hypertension     Patient Active Problem List   Diagnosis Date Noted  . Porokeratosis 04/18/2015    Past Surgical History:  Procedure Laterality Date  . APPENDECTOMY    . CESAREAN SECTION    . FOOT SURGERY    . TUBAL LIGATION       OB History    No data available       Home Medications    Prior to Admission medications   Medication Sig Start Date End Date Taking? Authorizing Provider  acetaminophen (TYLENOL) 325 MG tablet Take 975 mg by mouth every 6 (six) hours as needed for mild pain or headache.   Yes [provider]  lisinopril (PRINIVIL,ZESTRIL) 40 MG tablet Take 40 mg by mouth daily.  11/09/16  Yes [provider]  vitamin B-12 (CYANOCOBALAMIN) 500 MCG tablet Take 500 mcg by mouth daily.   Yes [provider]    Family History No family history on file.  Social History Social History  Substance Use Topics  . Smoking status: Current Every Day Smoker    Packs/day: 1.00    Types: Cigarettes  . Smokeless tobacco: Never Used  . Alcohol use 0.0 oz/week     Comment: occas.     Allergies   Patient has no known allergies.   Review of Systems Review of Systems  All other systems reviewed and are negative.    Physical Exam Updated Vital Signs BP (!) 144/72   Pulse 62   Temp 98.4 F (36.9 C) (Oral)   Resp 13   Ht 5\' 7"  (1.702 m)   Wt 78 kg (172 lb)   SpO2 94%   BMI 26.94 kg/m   Physical Exam  Constitutional: She appears well-developed and well-nourished. No distress.  HENT:  Head: Normocephalic  and atraumatic.  Mouth/Throat: Oropharynx is clear and moist. No oropharyngeal exudate.  Eyes: Conjunctivae and EOM are normal. Pupils are equal, round, and reactive to light. Right eye exhibits no discharge. Left eye exhibits no discharge. No scleral icterus.  Neck: Normal range of motion. Neck supple. No JVD present. No thyromegaly present.  Cardiovascular: Normal rate, regular rhythm, normal heart sounds and intact distal pulses.  Exam reveals no gallop and no friction rub.   No murmur heard. Pulmonary/Chest: Effort normal and breath sounds normal. No respiratory distress. She has no wheezes. She has no rales.  Abdominal: Soft. Bowel sounds are normal. She  exhibits no distension and no mass. There is no tenderness.  Musculoskeletal: Normal range of motion. She exhibits no edema or tenderness.  Lymphadenopathy:    She has no cervical adenopathy.  Neurological: She is alert. Coordination normal.  Speech is clear, cranial nerves III through XII are intact, memory is intact, strength is normal in all 4 extremities including grips, sensation is intact to light touch and pinprick in all 4 extremities. Coordination as tested by finger-nose-finger is normal, no limb ataxia. Normal gait, normal reflexes at the patellar tendons bilaterally  Skin: Skin is warm and dry. No rash noted. No erythema.  Psychiatric: She has a normal mood and affect. Her behavior is normal.  Nursing note and vitals reviewed.    ED Treatments / Results  Labs (all labs ordered are listed, but only abnormal results are displayed) Labs Reviewed  COMPREHENSIVE METABOLIC PANEL - Abnormal; Notable for the following:       Result Value   AST 14 (*)    ALT 13 (*)    All other components within normal limits  URINALYSIS, ROUTINE W REFLEX MICROSCOPIC - Abnormal; Notable for the following:    Color, Urine STRAW (*)    Hgb urine dipstick SMALL (*)    Leukocytes, UA SMALL (*)    Bacteria, UA RARE (*)    Squamous Epithelial / LPF 0-5 (*)    All other components within normal limits  CBC WITH DIFFERENTIAL/PLATELET  TROPONIN I    EKG  EKG Interpretation  Date/Time:  Friday November 09 2016 17:33:53 EDT Ventricular Rate:  67 PR Interval:    QRS Duration: 101 QT Interval:  410 QTC Calculation: 433 R Axis:   58 Text Interpretation:  Sinus rhythm Probable anteroseptal infarct, old Borderline repolarization abnormality ST depressions in the inferior and lateral precordial leads. Abnormal ekg No old tracing to compare Confirmed by Noemi Chapel [phone]) on 11/09/2016 6:00:25 PM       Radiology Dg Chest 2 View  Result Date: 11/09/2016 CLINICAL DATA:  Hypertension and dizziness EXAM:  CHEST  2 VIEW COMPARISON:  11/13/2006 FINDINGS: The heart size and mediastinal contours are within normal limits. Both lungs are clear. The visualized skeletal structures are unremarkable. IMPRESSION: No active cardiopulmonary disease. Electronically Signed   By: Donavan Foil M.D.   On: 11/09/2016 18:39    Procedures Procedures (including critical care time)  Medications Ordered in ED Medications  amLODipine (NORVASC) tablet 5 mg (not administered)  aspirin chewable tablet 324 mg (324 mg Oral Given 11/09/16 1814)     Initial Impression / Assessment and Plan / ED Course  I have reviewed the triage vital signs and the nursing notes.  Pertinent labs & imaging results that were available during my care of the patient were reviewed by me and considered in my medical decision making (see chart for details).  The patient appears well  on clinical exam, the EKG is abnormal in that there are ST depressions in multiple areas. We'll obtain labs, I suspect that the findings are related to chronic hypertension however we'll obtain a troponin as well as a 3 hour troponin. Specifically the patient has no chest pain, no shortness of breath, no exertional symptoms.  She has no CP, SOB, numbness, headache or any other symtpoms on reevaluation at 7:45 - I have had discussion with pt re: indications for return - she is agreeable I restated the importance that she see cardiology for her abnormal ECG - needs stress test as outpatient - pt in agreement.  Trop and labs neg here.    Final Clinical Impressions(s) / ED Diagnoses   Final diagnoses:  Essential hypertension  Dizziness  Abnormal EKG    New Prescriptions New Prescriptions   No medications on file     Noemi Chapel, MD 11/09/16 [phone]

## 2016-11-09 NOTE — Discharge Instructions (Signed)
Take your new blood pressure medicine at the same time you take the lisinopril - you should measure your blood pressure 2 hours after taking your medicine at the same time daily.  ER for worsening symptoms

## 2016-11-09 NOTE — ED Triage Notes (Signed)
Patient sent here from Dr. Gerarda Fraction for abnormal EKG. Patient reports of hypertension and dizziness x1 month. Reports of dizziness worse when changing positions.

## 2016-11-13 ENCOUNTER — Encounter: Payer: Self-pay | Admitting: Cardiovascular Disease

## 2016-11-29 ENCOUNTER — Ambulatory Visit: Payer: BLUE CROSS/BLUE SHIELD | Admitting: Cardiovascular Disease

## 2016-12-10 NOTE — Progress Notes (Signed)
Cardiology Office Note   Date:  12/17/2016   ID:  [name], DOB [birth year], MRN [id number]  PCP:  Redmond School, MD  Cardiologist:   Jenkins Rouge, MD   No chief complaint on file.     History of Present Illness: Joy Alvarado is a 56 y.o. female who presents for consultation regarding HTN, dizziness and left arm numbness. Referred by primary Dr Gerarda Fraction and ER Dr. Sabra Heck.  Denies chest pain or dyspnea. Seen in ER 11/09/16 sent by primary due to above symptoms.  Vertigo like symptoms when she stands up too quickly. Last month BP labile and systolic as high as 537 mmHg.  She has her lisinopril/HCTZ stopped due to muscle cramps and was to start lisinopril and amlodipine with no diuretic 11/09/16 She is a current everyday smoker 1 ppd. ECG reported as abnormal. 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 Hypertension     Past Surgical History:  Procedure Laterality Date  . APPENDECTOMY    . CESAREAN SECTION    . FOOT SURGERY    . TUBAL LIGATION       Current Outpatient Prescriptions  Medication Sig Dispense Refill  . acetaminophen (TYLENOL) 325 MG tablet Take 975 mg by mouth every 6 (six) hours as needed for mild pain or headache.    Marland Kitchen amLODipine (NORVASC) 10 MG tablet Take 1 tablet (10 mg total) by mouth daily. 90 tablet 3  . aspirin EC 81 MG tablet Take 81 mg by mouth daily.    Marland Kitchen lisinopril (PRINIVIL,ZESTRIL) 40 MG tablet Take 40 mg by mouth daily.   2   No current facility-administered medications for this visit.     Allergies:   Patient has no known allergies.    Social History:  The patient  reports that she has been smoking Cigarettes.  She has been smoking about 1.00 pack per day. She has never used  smokeless tobacco. She reports that she drinks alcohol. She reports that she does not use drugs.   Family History:  The patient's family history is not on file.    ROS:  Please see the history of present illness.   Otherwise, review of systems are positive for none.   All other systems are reviewed and negative.    PHYSICAL EXAM: VS:  BP (!) 150/80   Pulse 72   Ht 5\' 7"  (1.702 m)   Wt 176 lb 6.4 oz (80 kg)   LMP  (LMP Unknown)   BMI 27.63 kg/m  , BMI Body mass index is 27.63 kg/m. Affect appropriate Healthy:  appears stated age 75: normal Neck supple with no adenopathy JVP normal no bruits no thyromegaly Lungs clear with no wheezing and good diaphragmatic motion Heart:  S1/S2 SEM 2/6 murmur, no rub, gallop or click PMI normal Abdomen: benighn, BS positve, no tenderness, no AAA no bruit.  No HSM or HJR Distal pulses intact with no bruits No edema Neuro non-focal Skin warm and dry No muscular weakness    EKG: See HPI personally reviewed  SR rate 72 nonspecific ST changes 12/17/16    Recent Labs: 11/09/2016: ALT 13; BUN 11; Creatinine, Ser  0.68; Hemoglobin 13.9; Platelets 191; Potassium 3.7; Sodium 141    Lipid Panel No results found for: CHOL, TRIG, HDL, CHOLHDL, VLDL, LDLCALC, LDLDIRECT    Wt Readings from Last 3 Encounters:  12/17/16 176 lb 6.4 oz (80 kg)  11/09/16 172 lb (78 kg)      Other studies Reviewed: Additional studies/ records that were reviewed today include: Notes primary Dr Gerarda Fraction Notes Dr Sabra Heck ER with CXR and labs 11/09/16 .    ASSESSMENT AND PLAN:  1.  HTN  Increase norvasc 10 mg f/u [place] 2. Dizziness doubt cardiac etiology f/u exercise myovue make sure exercise hemodynamics normal 3. Abnormal ECG related to LVH see below 4. Murmur:  Benign sounding SEM f/u echo    Current medicines are reviewed at length with the patient today.  The patient does not have concerns regarding medicines.  The following changes have been made:  Increase  norvasc 10 mg   Labs/ tests ordered today include: Echo and exercise myovue   Orders Placed This Encounter  Procedures  . Myocardial Perfusion Imaging  . EKG 12-Lead  . ECHOCARDIOGRAM COMPLETE     Disposition:   FU with cardiology PRN      Signed, Jenkins Rouge, MD  12/17/2016 9:09 AM    [facility] [place], [place], [place]  [address] Phone: [phone]; Fax: [phone]

## 2016-12-17 ENCOUNTER — Encounter (INDEPENDENT_AMBULATORY_CARE_PROVIDER_SITE_OTHER): Payer: Self-pay

## 2016-12-17 ENCOUNTER — Encounter: Payer: Self-pay | Admitting: Cardiovascular Disease

## 2016-12-17 ENCOUNTER — Ambulatory Visit (INDEPENDENT_AMBULATORY_CARE_PROVIDER_SITE_OTHER): Payer: BLUE CROSS/BLUE SHIELD | Admitting: Cardiovascular Disease

## 2016-12-17 VITALS — BP 150/80 | HR 72 | Ht 67.0 in | Wt 176.4 lb

## 2016-12-17 DIAGNOSIS — R55 Syncope and collapse: Secondary | ICD-10-CM | POA: Diagnosis not present

## 2016-12-17 DIAGNOSIS — R011 Cardiac murmur, unspecified: Secondary | ICD-10-CM

## 2016-12-17 MED ORDER — AMLODIPINE BESYLATE 10 MG PO TABS: 10.0000 mg | ORAL_TABLET | Freq: Every day | ORAL | 3 refills | Status: DC

## 2016-12-17 NOTE — Patient Instructions (Addendum)
Medication Instructions:  Your physician has recommended you make the following change in your medication:  1-Increase Norvasc 10 mg by mouth daily  Labwork: NONE  Testing/Procedures: Your physician has requested that you have an echocardiogram. Echocardiography is a painless test that uses sound waves to create images of your heart. It provides your doctor with information about the size and shape of your heart and how well your heart's chambers and valves are working. This procedure takes approximately one hour. There are no restrictions for this procedure.  Your physician has requested that you have en exercise stress myoview. For further information please visit [email or web address]. Please follow instruction sheet, as given.  Follow-Up: Your physician wants you to follow-up as needed with Dr. Johnsie Cancel.    If you need a refill on your cardiac medications before your next appointment, please call your pharmacy.

## 2017-01-10 ENCOUNTER — Telehealth (HOSPITAL_COMMUNITY): Payer: Self-pay | Admitting: *Deleted

## 2017-01-10 ENCOUNTER — Telehealth (HOSPITAL_COMMUNITY): Payer: Self-pay | Admitting: Radiology

## 2017-01-10 NOTE — Telephone Encounter (Signed)
Left message on voicemail in reference to upcoming appointment scheduled for 01/18/17. Phone number given for a call back so details instructions can be given. Joy Alvarado

## 2017-01-10 NOTE — Telephone Encounter (Signed)
Husband given detailed instructions per Myocardial Perfusion Study Information Sheet for the test on 01/18/17  Patient notified to arrive 15 minutes early and that it is imperative to arrive on time for appointment to keep from having the test rescheduled.  If you need to cancel or reschedule your appointment, please call the office within 24 hours of your appointment. . Husband verbalized understanding.EJY

## 2017-01-18 ENCOUNTER — Other Ambulatory Visit: Payer: Self-pay

## 2017-01-18 ENCOUNTER — Ambulatory Visit (HOSPITAL_BASED_OUTPATIENT_CLINIC_OR_DEPARTMENT_OTHER): Payer: BLUE CROSS/BLUE SHIELD

## 2017-01-18 ENCOUNTER — Ambulatory Visit (HOSPITAL_COMMUNITY): Payer: BLUE CROSS/BLUE SHIELD | Attending: Internal Medicine

## 2017-01-18 DIAGNOSIS — I10 Essential (primary) hypertension: Secondary | ICD-10-CM | POA: Diagnosis not present

## 2017-01-18 DIAGNOSIS — R011 Cardiac murmur, unspecified: Secondary | ICD-10-CM | POA: Insufficient documentation

## 2017-01-18 DIAGNOSIS — R55 Syncope and collapse: Secondary | ICD-10-CM | POA: Diagnosis not present

## 2017-01-18 DIAGNOSIS — I351 Nonrheumatic aortic (valve) insufficiency: Secondary | ICD-10-CM | POA: Insufficient documentation

## 2017-01-18 DIAGNOSIS — R42 Dizziness and giddiness: Secondary | ICD-10-CM | POA: Insufficient documentation

## 2017-01-18 DIAGNOSIS — F17210 Nicotine dependence, cigarettes, uncomplicated: Secondary | ICD-10-CM | POA: Diagnosis not present

## 2017-01-18 LAB — MYOCARDIAL PERFUSION IMAGING
Estimated workload: 10.1 METS
Exercise duration (min): 9 min
Exercise duration (sec): 0 s
LV dias vol: 99 mL (ref 46–106)
LV sys vol: 39 mL
MPHR: 166 {beats}/min
Peak HR: 142 {beats}/min
Percent HR: 85 %
RATE: 0.3
Rest HR: 63 {beats}/min
SDS: 0
SRS: 1
SSS: 1
TID: 0.99

## 2017-01-18 MED ORDER — TECHNETIUM TC 99M TETROFOSMIN IV KIT
32.6000 | PACK | Freq: Once | INTRAVENOUS | Status: AC | PRN
  Administered 2017-01-18: 32.6 via INTRAVENOUS
  Filled 2017-01-18: qty 33

## 2017-01-18 MED ORDER — TECHNETIUM TC 99M TETROFOSMIN IV KIT
10.1000 | PACK | Freq: Once | INTRAVENOUS | Status: AC | PRN
  Administered 2017-01-18: 10.1 via INTRAVENOUS
  Filled 2017-01-18: qty 11

## 2017-01-21 ENCOUNTER — Telehealth: Payer: Self-pay

## 2017-01-21 NOTE — Telephone Encounter (Signed)
-----   Message from Josue Hector, MD sent at 01/20/2017  1:48 PM EDT ----- EF normal just mild AR overall echo is fine

## 2017-01-21 NOTE — Telephone Encounter (Signed)
Called pt, no answer- left message for pt to return call.  

## 2017-06-05 DIAGNOSIS — G2581 Restless legs syndrome: Secondary | ICD-10-CM | POA: Diagnosis not present

## 2017-06-05 DIAGNOSIS — Z23 Encounter for immunization: Secondary | ICD-10-CM | POA: Diagnosis not present

## 2017-06-05 DIAGNOSIS — E663 Overweight: Secondary | ICD-10-CM | POA: Diagnosis not present

## 2017-06-05 DIAGNOSIS — I10 Essential (primary) hypertension: Secondary | ICD-10-CM | POA: Diagnosis not present

## 2017-06-05 DIAGNOSIS — Z0001 Encounter for general adult medical examination with abnormal findings: Secondary | ICD-10-CM | POA: Diagnosis not present

## 2017-06-05 DIAGNOSIS — M5431 Sciatica, right side: Secondary | ICD-10-CM | POA: Diagnosis not present

## 2017-06-05 DIAGNOSIS — Z6827 Body mass index (BMI) 27.0-27.9, adult: Secondary | ICD-10-CM | POA: Diagnosis not present

## 2017-06-05 DIAGNOSIS — I872 Venous insufficiency (chronic) (peripheral): Secondary | ICD-10-CM | POA: Diagnosis not present

## 2017-06-05 DIAGNOSIS — G894 Chronic pain syndrome: Secondary | ICD-10-CM | POA: Diagnosis not present

## 2017-06-05 DIAGNOSIS — R600 Localized edema: Secondary | ICD-10-CM | POA: Diagnosis not present

## 2017-06-13 ENCOUNTER — Encounter (INDEPENDENT_AMBULATORY_CARE_PROVIDER_SITE_OTHER): Payer: Self-pay | Admitting: *Deleted

## 2017-06-19 ENCOUNTER — Encounter: Payer: Self-pay | Admitting: Internal Medicine

## 2017-07-15 ENCOUNTER — Ambulatory Visit: Payer: BLUE CROSS/BLUE SHIELD

## 2017-12-11 ENCOUNTER — Other Ambulatory Visit: Payer: Self-pay | Admitting: Cardiovascular Disease

## 2018-03-26 DIAGNOSIS — M19072 Primary osteoarthritis, left ankle and foot: Secondary | ICD-10-CM | POA: Diagnosis not present

## 2018-03-26 DIAGNOSIS — M19071 Primary osteoarthritis, right ankle and foot: Secondary | ICD-10-CM | POA: Diagnosis not present

## 2018-03-26 DIAGNOSIS — M67472 Ganglion, left ankle and foot: Secondary | ICD-10-CM | POA: Diagnosis not present

## 2018-03-26 DIAGNOSIS — M25774 Osteophyte, right foot: Secondary | ICD-10-CM | POA: Diagnosis not present

## 2018-03-26 DIAGNOSIS — M67471 Ganglion, right ankle and foot: Secondary | ICD-10-CM | POA: Diagnosis not present

## 2018-03-26 DIAGNOSIS — M25775 Osteophyte, left foot: Secondary | ICD-10-CM | POA: Diagnosis not present

## 2018-04-16 ENCOUNTER — Telehealth: Payer: Self-pay | Admitting: *Deleted

## 2018-04-16 ENCOUNTER — Ambulatory Visit (INDEPENDENT_AMBULATORY_CARE_PROVIDER_SITE_OTHER): Payer: BLUE CROSS/BLUE SHIELD | Admitting: Podiatry

## 2018-04-16 ENCOUNTER — Encounter: Payer: Self-pay | Admitting: Podiatry

## 2018-04-16 ENCOUNTER — Other Ambulatory Visit: Payer: Self-pay | Admitting: Podiatry

## 2018-04-16 ENCOUNTER — Ambulatory Visit (INDEPENDENT_AMBULATORY_CARE_PROVIDER_SITE_OTHER): Payer: BLUE CROSS/BLUE SHIELD

## 2018-04-16 VITALS — BP 154/80 | HR 72

## 2018-04-16 DIAGNOSIS — M898X7 Other specified disorders of bone, ankle and foot: Secondary | ICD-10-CM | POA: Diagnosis not present

## 2018-04-16 DIAGNOSIS — M79671 Pain in right foot: Secondary | ICD-10-CM

## 2018-04-16 DIAGNOSIS — M79672 Pain in left foot: Secondary | ICD-10-CM

## 2018-04-16 MED ORDER — TRAMADOL HCL 50 MG PO TABS: 50.0000 mg | ORAL_TABLET | Freq: Three times a day (TID) | ORAL | 0 refills | Status: DC | PRN

## 2018-04-16 NOTE — Telephone Encounter (Signed)
Orders to J. Quintana, RN for pre-cert, faxed to [facility]. 

## 2018-04-16 NOTE — Telephone Encounter (Signed)
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 Amalia Hailey

## 2018-04-16 NOTE — Progress Notes (Signed)
   HPI: 57 year old female otherwise healthy presents the office today for evaluation of bilateral lateral rear foot pain.  Patient has experienced this pain for most of her life.  She presents as a new patient referral from Dr. Gershon Mussel.  Patient continues to get sharp pains of burning sensation on the lateral aspect of the bilateral feet.  The pain has increased significantly over the past 6-7 months.  Conservative modalities have been unsuccessful to provide any relief of symptoms for the patient.  She presents for further treatment evaluation  Past Medical History:  Diagnosis Date  . Hypertension      Physical Exam: General: The patient is alert and oriented x3 in no acute distress.  Dermatology: Skin is warm, dry and supple bilateral lower extremities. Negative for open lesions or macerations.  Vascular: Palpable pedal pulses bilaterally. No edema or erythema noted. Capillary refill within normal limits.  Neurological: Epicritic and protective threshold grossly intact bilaterally.   Musculoskeletal Exam: Range of motion within normal limits to all pedal and ankle joints bilateral. Muscle strength 5/5 in all groups bilateral.  Large palpable exostosis noted to the lateral portion of the calcaneus bilateral.  More prominent on the left foot.  Radiographic Exam:  Normal osseous mineralization. Joint spaces preserved. No fracture/dislocation/boney destruction.  There are large exostoses noted to the bilateral calcaneus lateral aspect.  Assessment: 1.  Exostosis calcaneus bilateral   Plan of Care:  1. Patient evaluated. X-Rays reviewed.  2.  Today we are going to order CT scan bilateral feet to better visualize the large exostosis to the bilateral heels. 3.  Continue Duexis oral anti-inflammatory as per Dr. Gershon Mussel 4.  Prescription for tramadol 50 mg #60 every 8 hours as needed pain 5.  Return to clinic in 3 weeks to review the CT scan results and for surgical consultation.  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 Edrick Kins, DPM    [address], [place] [address]                Office [phone]  Fax [phone]

## 2018-04-22 ENCOUNTER — Telehealth: Payer: Self-pay | Admitting: *Deleted

## 2018-04-22 NOTE — Telephone Encounter (Signed)
[place] asked which CT was approved and if we were still working on the other. I told Melanie - [facility] Pre-cert the Left CT had been approved and the pre-cert coordinator was still working on the right CT.

## 2018-04-24 ENCOUNTER — Ambulatory Visit (HOSPITAL_COMMUNITY): Payer: BLUE CROSS/BLUE SHIELD

## 2018-04-24 ENCOUNTER — Ambulatory Visit (HOSPITAL_COMMUNITY)
Admission: RE | Admit: 2018-04-24 | Discharge: 2018-04-24 | Disposition: A | Discharge status: Home / Self Care | Payer: BLUE CROSS/BLUE SHIELD | Source: Ambulatory Visit | Attending: Podiatry | Admitting: Podiatry

## 2018-04-24 ENCOUNTER — Telehealth: Payer: Self-pay | Admitting: Podiatry

## 2018-04-24 DIAGNOSIS — M19072 Primary osteoarthritis, left ankle and foot: Secondary | ICD-10-CM | POA: Diagnosis not present

## 2018-04-24 DIAGNOSIS — M898X7 Other specified disorders of bone, ankle and foot: Secondary | ICD-10-CM

## 2018-04-24 DIAGNOSIS — M19071 Primary osteoarthritis, right ankle and foot: Secondary | ICD-10-CM | POA: Diagnosis not present

## 2018-04-24 NOTE — Telephone Encounter (Signed)
CT from [facility] called again to follow up on pre-cert for CT on right foot.

## 2018-05-07 ENCOUNTER — Encounter: Payer: Self-pay | Admitting: Podiatry

## 2018-05-07 ENCOUNTER — Ambulatory Visit: Payer: BLUE CROSS/BLUE SHIELD | Admitting: Podiatry

## 2018-05-07 DIAGNOSIS — M898X7 Other specified disorders of bone, ankle and foot: Secondary | ICD-10-CM

## 2018-05-07 MED ORDER — DICLOFENAC SODIUM 75 MG PO TBEC: 75.0000 mg | DELAYED_RELEASE_TABLET | Freq: Two times a day (BID) | ORAL | 1 refills | Status: DC

## 2018-05-07 MED ORDER — HYDROCODONE-ACETAMINOPHEN 5-325 MG PO TABS: 1.0000 | ORAL_TABLET | Freq: Four times a day (QID) | ORAL | 0 refills | Status: DC | PRN

## 2018-05-07 NOTE — Patient Instructions (Signed)
Pre-Operative Instructions  Congratulations, you have decided to take an important step towards improving your quality of life.  You can be assured that the doctors and staff at [facility] will be with you every step of the way.  Here are some important things you should know:  1. Plan to be at the surgery center/hospital at least 1 (one) hour prior to your scheduled time, unless otherwise directed by the surgical center/hospital staff.  You must have a responsible adult accompany you, remain during the surgery and drive you home.  Make sure you have directions to the surgical center/hospital to ensure you arrive on time. 2. If you are having surgery at [facility] or [facility], you will need a copy of your medical history and physical form from your family physician within one month prior to the date of surgery. We will give you a form for your primary physician to complete.  3. We make every effort to accommodate the date you request for surgery.  However, there are times where surgery dates or times have to be moved.  We will contact you as soon as possible if a change in schedule is required.   4. No aspirin/ibuprofen for one week before surgery.  If you are on aspirin, any non-steroidal anti-inflammatory medications (Mobic, Aleve, Ibuprofen) should not be taken seven (7) days prior to your surgery.  You make take Tylenol for pain prior to surgery.  5. Medications - If you are taking daily heart and blood pressure medications, seizure, reflux, allergy, asthma, anxiety, pain or diabetes medications, make sure you notify the surgery center/hospital before the day of surgery so they can tell you which medications you should take or avoid the day of surgery. 6. No food or drink after midnight the night before surgery unless directed otherwise by surgical center/hospital staff. 7. No alcoholic beverages 24-hours prior to surgery.  No smoking 24-hours prior or 24-hours after  surgery. 8. Wear loose pants or shorts. They should be loose enough to fit over bandages, boots, and casts. 9. Don't wear slip-on shoes. Sneakers are preferred. 10. Bring your boot with you to the surgery center/hospital.  Also bring crutches or a walker if your physician has prescribed it for you.  If you do not have this equipment, it will be provided for you after surgery. 11. If you have not been contacted by the surgery center/hospital by the day before your surgery, call to confirm the date and time of your surgery. 12. Leave-time from work may vary depending on the type of surgery you have.  Appropriate arrangements should be made prior to surgery with your employer. 13. Prescriptions will be provided immediately following surgery by your doctor.  Fill these as soon as possible after surgery and take the medication as directed. Pain medications will not be refilled on weekends and must be approved by the doctor. 14. Remove nail polish on the operative foot and avoid getting pedicures prior to surgery. 15. Wash the night before surgery.  The night before surgery wash the foot and leg well with water and the antibacterial soap provided. Be sure to pay special attention to beneath the toenails and in between the toes.  Wash for at least three (3) minutes. Rinse thoroughly with water and dry well with a towel.  Perform this wash unless told not to do so by your physician.  Enclosed: 1 Ice pack (please put in freezer the night before surgery)   1 Hibiclens skin cleaner     Pre-op instructions  If you have any questions regarding the instructions, please do not hesitate to call our office.  [place]: [address], [place], [place] [address] -- [phone]  [place]: [address] [place], [place] [address] -- [phone]  [place]: [address]  [place], [place] [address] -- [phone]  [place]: [address], [address], [place], [place] [address] -- [phone]  Website:  [email or web address] 

## 2018-05-12 NOTE — Progress Notes (Addendum)
   HPI: 57 year old otherwise healthy female presents to the office today for follow up evaluation of bilateral foot pain. She is here for her CT results. She states she was unable to get a CT of the right foot secondary to insurance. Patient is here for further evaluation and treatment.patient also complains of hammertoes to the third digits bilaterally.  Patient has had previous surgery for hammertoe repair however they have recurred.  The left hammertoe is more symptomatic with shoe gear.  She would like to discuss different treatment options to straighten the toe.   Past Medical History:  Diagnosis Date  . Hypertension      Physical Exam: General: The patient is alert and oriented x3 in no acute distress.  Dermatology: Skin is warm, dry and supple bilateral lower extremities. Negative for open lesions or macerations.  Vascular: Palpable pedal pulses bilaterally. No edema or erythema noted. Capillary refill within normal limits.  Neurological: Epicritic and protective threshold grossly intact bilaterally.   Musculoskeletal Exam: Range of motion within normal limits to all pedal and ankle joints bilateral. Muscle strength 5/5 in all groups bilateral.  Large palpable exostosis noted to the lateral portion of the calcaneus bilateral.  More prominent on the left foot.symptomatic hammertoe contracture noted to the third digit left foot.  CT Impression:  1. Prominent peroneal tubercle of the lateral calcaneus.  2. Suspect peroneal tendinopathy and or tenosynovitis. MRI may be helpful for further evaluation if clinically necessary. 3. Minimal/mild degenerative changes involving the hindfoot and midfoot but no acute bony findings or osteochondral lesion. 4. Mild spurring changes at the Achilles attachment site.  Assessment: 1. Prominent peroneal tubercle bilateral 2.  recurrentHammertoe contracture third digit left foot symptomatic  Plan of Care:  1. Patient evaluated. CT reviewed.  2.  Today we discussed the conservative versus surgical management of the presenting pathology. The patient opts for surgical management. All possible complications and details of the procedure were explained. All patient questions were answered. No guarantees were expressed or implied. 3. Authorization for surgery was initiated today. Surgery will consist of calcaneal exostectomy bilateral. Hammertoe repair with internal screw fixation third digit left foot 4. Prescription for Vicodin 5/325 mg provided to patient.  5. Prescription for Diclofenac 75 mg #60 provided to patient.  6. Return to clinic one week post op.   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      Edrick Kins, DPM [facility]  Dr. Edrick Kins, DPM    [address], [place] [address]                Office [phone]  Fax [phone]

## 2018-06-16 DIAGNOSIS — I10 Essential (primary) hypertension: Secondary | ICD-10-CM | POA: Diagnosis not present

## 2018-06-16 DIAGNOSIS — Z6828 Body mass index (BMI) 28.0-28.9, adult: Secondary | ICD-10-CM | POA: Diagnosis not present

## 2018-06-16 DIAGNOSIS — G2581 Restless legs syndrome: Secondary | ICD-10-CM | POA: Diagnosis not present

## 2018-06-16 DIAGNOSIS — Z0001 Encounter for general adult medical examination with abnormal findings: Secondary | ICD-10-CM | POA: Diagnosis not present

## 2018-06-16 DIAGNOSIS — E663 Overweight: Secondary | ICD-10-CM | POA: Diagnosis not present

## 2018-06-16 DIAGNOSIS — G894 Chronic pain syndrome: Secondary | ICD-10-CM | POA: Diagnosis not present

## 2018-06-16 DIAGNOSIS — Z1389 Encounter for screening for other disorder: Secondary | ICD-10-CM | POA: Diagnosis not present

## 2018-06-17 DIAGNOSIS — M79676 Pain in unspecified toe(s): Secondary | ICD-10-CM

## 2018-06-26 ENCOUNTER — Telehealth: Payer: Self-pay | Admitting: Podiatry

## 2018-06-26 NOTE — Telephone Encounter (Signed)
I called Joy Alvarado and informed Dr. Amalia Hailey was still in surgery and had not been able to contact me with information, but I would call as soon as possible. I asked pt if the surgery coordinator had called and he said no, but someone had texted him. I told pt if he has a general surgery center question I would be able to help. Joy Alvarado states he knew pt was scheduled for 07/10/2018 but no one had called with the time. I told Joy Alvarado the surgery center would call about 24-48 hour prior to the surgery with pt's arrival time and surgery time, that if he wanted I could give him the surgery center phone number. Joy Alvarado stated he had it. I told him I would have the surgery coordinator call.

## 2018-06-26 NOTE — Telephone Encounter (Signed)
I called pt's husband, Josph Macho states pt called this morning from work, stating her ankles are already swelling. Josph Macho states pt is having swelling and pain after work, diclofenac is no longer helping and ice and elevation help some. I told Josph Macho to have pt continue with ice, elevate as allowed with work, and I would inform Dr. Amalia Hailey to for other orders. Josph Macho states he would like to be call, he does not text and would also like a call from Surgery Coordinator.

## 2018-06-26 NOTE — Telephone Encounter (Signed)
Joy Alvarado's ankles are swelling really bad. The medicine is not working.

## 2018-06-26 NOTE — Telephone Encounter (Signed)
Pts husband called again stating he had left a message this morning but had not heard anything back regarding his wives ankles swelling and would also like to speak with surgical coordinator regarding pts surgery.   Pts husband stated he wanted to be called, he does not text.

## 2018-06-27 NOTE — Telephone Encounter (Signed)
I'm returning your call.  How can I help you?  "Someone is sending her text messages about doing his history on line.  We don't have internet to do that."  Someone from the surgical center will call her a day or two prior to her surgery date and they will give her the arrival time and you can give them the information that they need.  "I reckon we'll see what they want when they call back.  My wife is going to see if my daughter can help he answer the question on the internet.  We'll see how it goes."

## 2018-07-10 ENCOUNTER — Encounter: Payer: Self-pay | Admitting: Podiatry

## 2018-07-10 ENCOUNTER — Other Ambulatory Visit: Payer: Self-pay | Admitting: Podiatry

## 2018-07-10 DIAGNOSIS — M7732 Calcaneal spur, left foot: Secondary | ICD-10-CM | POA: Diagnosis not present

## 2018-07-10 DIAGNOSIS — M7731 Calcaneal spur, right foot: Secondary | ICD-10-CM | POA: Diagnosis not present

## 2018-07-10 DIAGNOSIS — M79671 Pain in right foot: Secondary | ICD-10-CM | POA: Diagnosis not present

## 2018-07-10 DIAGNOSIS — M25775 Osteophyte, left foot: Secondary | ICD-10-CM | POA: Diagnosis not present

## 2018-07-10 DIAGNOSIS — M2042 Other hammer toe(s) (acquired), left foot: Secondary | ICD-10-CM | POA: Diagnosis not present

## 2018-07-10 DIAGNOSIS — I10 Essential (primary) hypertension: Secondary | ICD-10-CM | POA: Diagnosis not present

## 2018-07-10 DIAGNOSIS — M25774 Osteophyte, right foot: Secondary | ICD-10-CM | POA: Diagnosis not present

## 2018-07-10 MED ORDER — OXYCODONE-ACETAMINOPHEN 5-325 MG PO TABS: 1.0000 | ORAL_TABLET | Freq: Four times a day (QID) | ORAL | 0 refills | Status: DC | PRN

## 2018-07-10 NOTE — Progress Notes (Signed)
.  postop

## 2018-07-11 ENCOUNTER — Telehealth: Payer: Self-pay | Admitting: *Deleted

## 2018-07-11 MED ORDER — OXYCODONE-ACETAMINOPHEN 10-325 MG PO TABS: 1.0000 | ORAL_TABLET | ORAL | 0 refills | Status: DC | PRN

## 2018-07-11 NOTE — Telephone Encounter (Signed)
Sent earlier today

## 2018-07-11 NOTE — Telephone Encounter (Signed)
Pt's husband, Josph Macho states pt is having real bad pain, burning when walking. Pt and dtr are yelling over the husband on the phone. I explained that pt should not be up on the surgery feet more than 15 minutes/hour and not to dangle, and must be in the boots at all times. I instructed everyone on the phone at the time, to take off the boots, open-ended socks, remove the ace wraps do not touch the gauze, elevate the feet for 15 minutes, if the pain worsens elevated then dangle for 15 minutes, then place the feet level with the hips and beginning at the toes rewrap the ace looser single layer up the leg, reapply the socks and boots. Pt's dtr yelled in the background could pt double the pain medication a few days, I told whoever was on the phone at the time, I would check with another doctor for change of medication. I placed pt on hold to speak with Dr. March Rummage. Dr. March Rummage states increasing the percocet 5/325 would increase the tylenol too much, he will write for percocet 10/325mg . I told pt, not to take anymore of the percocet 5/325 once the percocet 10/325mg  had been received and take percocet 5/325 as a step down medication because she would only be on the percocet 10/325mg  a short period of time. Pt and family states understanding. Pt's dtr said the doctor said not to take the ace wraps off. I told her that was correct, unless pt was having a problem then she should follow the triage nurse instructions.

## 2018-07-16 ENCOUNTER — Ambulatory Visit (INDEPENDENT_AMBULATORY_CARE_PROVIDER_SITE_OTHER): Payer: BLUE CROSS/BLUE SHIELD | Admitting: Podiatry

## 2018-07-16 ENCOUNTER — Ambulatory Visit (INDEPENDENT_AMBULATORY_CARE_PROVIDER_SITE_OTHER): Payer: BLUE CROSS/BLUE SHIELD

## 2018-07-16 VITALS — Temp 97.5°F

## 2018-07-16 DIAGNOSIS — Z09 Encounter for follow-up examination after completed treatment for conditions other than malignant neoplasm: Secondary | ICD-10-CM | POA: Diagnosis not present

## 2018-07-16 DIAGNOSIS — M898X7 Other specified disorders of bone, ankle and foot: Secondary | ICD-10-CM | POA: Diagnosis not present

## 2018-07-16 MED ORDER — OXYCODONE-ACETAMINOPHEN 10-325 MG PO TABS: 1.0000 | ORAL_TABLET | Freq: Four times a day (QID) | ORAL | 0 refills | Status: DC | PRN

## 2018-07-20 NOTE — Progress Notes (Signed)
   Subjective:  Patient presents today status post peroneal tubercle exostectomy bilaterally and hammertoe repair of the third digit of the left foot. DOS: 07/10/2018. She reports a pulling and burning sensation of the right ankle. She denies any complaints regarding the left foot. She has been using the post op shoes and taking Percocet as directed. Patient is here for further evaluation and treatment.    Past Medical History:  Diagnosis Date  . Hypertension       Objective/Physical Exam Neurovascular status intact.  Skin incisions appear to be well coapted with sutures and staples intact. No sign of infectious process noted. No dehiscence. No active bleeding noted. Moderate edema noted to the surgical extremity.  Radiographic Exam:  Orthopedic hardware and osteotomies sites appear to be stable with routine healing.  Assessment: 1. s/p peroneal tubercle exostectomy bilaterally and hammertoe repair digit 3 left. DOS: 07/10/2018   Plan of Care:  1. Patient was evaluated. X-rays reviewed 2. Dressing changed.  3. Continue weightbearing in post op shoes.  4. Prescription for Percocet 10/325 mg provided to patient.  5. Return to clinic in one week.    Edrick Kins, DPM [facility]  Dr. Edrick Kins, [place] [address]                                        [place], [place] [address]                Office [phone]  Fax [phone]

## 2018-07-28 ENCOUNTER — Encounter: Payer: Self-pay | Admitting: Podiatry

## 2018-07-28 ENCOUNTER — Ambulatory Visit (INDEPENDENT_AMBULATORY_CARE_PROVIDER_SITE_OTHER): Payer: BLUE CROSS/BLUE SHIELD | Admitting: Podiatry

## 2018-07-28 DIAGNOSIS — M898X7 Other specified disorders of bone, ankle and foot: Secondary | ICD-10-CM

## 2018-07-28 DIAGNOSIS — Z09 Encounter for follow-up examination after completed treatment for conditions other than malignant neoplasm: Secondary | ICD-10-CM

## 2018-07-30 NOTE — Progress Notes (Signed)
Subjective:   Patient ID: Joy Alvarado, female   DOB: 58 y.o.   MRN: [id number]   HPI Patient states overall is doing well with pain gradually improving and able to walk better than previous   ROS      Objective:  Physical Exam  Neurovascular status intact negative Homans sign noted with incisions on the lateral side of the ankle bilateral healing well with wound edges well coapted staples in place and no gapping of the incision site.     Assessment:  Overall doing well.  Medial spur excision left with incision sites healing well and mild edema consistent with the postop.     Plan:  H&P condition reviewed every other staple removed.  Sterile dressings reapplied continued elevation compression immobilization and reappoint to see Dr. Amalia Hailey and for removal of remaining staples

## 2018-08-04 ENCOUNTER — Encounter: Payer: Self-pay | Admitting: Podiatry

## 2018-08-04 ENCOUNTER — Ambulatory Visit (INDEPENDENT_AMBULATORY_CARE_PROVIDER_SITE_OTHER): Payer: BLUE CROSS/BLUE SHIELD | Admitting: Podiatry

## 2018-08-04 VITALS — BP 121/64 | HR 75 | Temp 98.4°F | Resp 14

## 2018-08-04 DIAGNOSIS — M898X7 Other specified disorders of bone, ankle and foot: Secondary | ICD-10-CM

## 2018-08-04 DIAGNOSIS — Z09 Encounter for follow-up examination after completed treatment for conditions other than malignant neoplasm: Secondary | ICD-10-CM

## 2018-08-06 NOTE — Progress Notes (Signed)
   Subjective:  Patient presents today status post peroneal tubercle exostectomy bilaterally and hammertoe repair of the third digit of the left foot. DOS: 07/10/2018. She reports pain to the dorsal left foot and at the incision area. She also states the right foot is tender. There are no modifying factors noted. She has been using the post op shoes as directed. Patient is here for further evaluation and treatment.    Past Medical History:  Diagnosis Date  . Hypertension       Objective/Physical Exam Neurovascular status intact.  Skin incisions appear to be well coapted with sutures and staples intact. No sign of infectious process noted. No dehiscence. No active bleeding noted. Moderate edema noted to the surgical extremity.  Assessment: 1. s/p peroneal tubercle exostectomy bilaterally and hammertoe repair digit 3 left. DOS: 07/10/2018   Plan of Care:  1. Patient was evaluated.  2. Remaining staples removed.  3. Compression anklet dispensed.  4. Continue using post op shoes.  5. Return to clinic in 4 weeks.    Edrick Kins, DPM [facility]  Dr. Edrick Kins, [address]                                        [place], [place] [address]                Office [phone]  Fax [phone]

## 2018-09-01 ENCOUNTER — Encounter: Payer: Self-pay | Admitting: Podiatry

## 2018-09-01 ENCOUNTER — Ambulatory Visit (INDEPENDENT_AMBULATORY_CARE_PROVIDER_SITE_OTHER): Payer: BLUE CROSS/BLUE SHIELD

## 2018-09-01 ENCOUNTER — Ambulatory Visit (INDEPENDENT_AMBULATORY_CARE_PROVIDER_SITE_OTHER): Payer: BLUE CROSS/BLUE SHIELD | Admitting: Podiatry

## 2018-09-01 ENCOUNTER — Other Ambulatory Visit: Payer: Self-pay

## 2018-09-01 DIAGNOSIS — Z09 Encounter for follow-up examination after completed treatment for conditions other than malignant neoplasm: Secondary | ICD-10-CM

## 2018-09-01 DIAGNOSIS — M898X7 Other specified disorders of bone, ankle and foot: Secondary | ICD-10-CM | POA: Diagnosis not present

## 2018-09-01 NOTE — Progress Notes (Signed)
   Subjective:  Patient presents today status post peroneal tubercle exostectomy bilaterally and hammertoe repair of the third digit of the left foot. DOS: 07/10/2018.  Patient continues to have some pain and tenderness to the left surgical site.  She also has experienced increased edema to the left foot versus the right.  Patient has been weightbearing in crocs sandals with minimal discomfort.    Past Medical History:  Diagnosis Date  . Hypertension       Objective/Physical Exam Neurovascular status intact.  Skin incisions appear to be well coapted. No dehiscence.  There continues to be some moderate edema noted to the left foot dorsal as well as to the surgical incision site left ankle.  Tenderness to palpation also noted at the insertion of the peroneal tendon onto the fifth metatarsal tubercle  Radiographic Exam:  Osteotomies sites appear to be stable with routine healing.  Orthopedic screw intact to the third digit left foot with routine healing  Assessment: 1. s/p peroneal tubercle exostectomy bilaterally and hammertoe repair digit 3 left. DOS: 07/10/2018   Plan of Care:  1. Patient was evaluated. X-rays reviewed 2.  Injection of 0.5 cc Celestone Soluspan injected along the distal insertional portion of the peroneal tendon left to help with pain and edema as well as any scar tissue within the distal incision site causing any tenderness or adhesions 3.  Continue wearing below-knee compression socks 4.  Transition into good supportive shoes. 5.  Return to clinic in 3 weeks.  Patient is set to return to work 09/20/2018.  We will reevaluate just prior to return to work date  Edrick Kins, [place] [facility]  Dr. Edrick Kins, [address]                                        [place], [place] [address]                Office [phone]  Fax [phone]

## 2018-09-17 ENCOUNTER — Other Ambulatory Visit: Payer: Self-pay

## 2018-09-17 ENCOUNTER — Ambulatory Visit (INDEPENDENT_AMBULATORY_CARE_PROVIDER_SITE_OTHER): Payer: BLUE CROSS/BLUE SHIELD | Admitting: Podiatry

## 2018-09-17 ENCOUNTER — Encounter: Payer: Self-pay | Admitting: Podiatry

## 2018-09-17 DIAGNOSIS — Z09 Encounter for follow-up examination after completed treatment for conditions other than malignant neoplasm: Secondary | ICD-10-CM

## 2018-09-17 DIAGNOSIS — M898X7 Other specified disorders of bone, ankle and foot: Secondary | ICD-10-CM

## 2018-09-17 NOTE — Progress Notes (Signed)
   Subjective:  Patient presents today status post peroneal tubercle exostectomy bilaterally and hammertoe repair of the third digit of the left foot. DOS: 07/10/2018.  Patient has noticed significant improvement since undergoing surgery.  She is currently able to wear certain shoes and has slowly been able to improve ambulation. She also states that since surgery her restless leg syndrome has been alleviated.  She has suffered from restless leg syndrome for 25+ years.  She has not had experience any RLS symptoms since surgery.  This was unexpected but we are both very surprised/satisfied that surgery helped alleviate her symptoms.  Past Medical History:  Diagnosis Date  . Hypertension       Objective/Physical Exam Neurovascular status intact.  Skin incisions appear to be well coapted.  There continues to be some minimal edema noted to the bilateral feet.  No open lesions noted.  No pain on palpation noted.  Assessment: 1. s/p peroneal tubercle exostectomy bilaterally and hammertoe repair digit 3 left. DOS: 07/10/2018   Plan of Care:  1. Patient was evaluated. 2.  Patient states that the injection she received last visit helped significantly.  This can always be an option if she does have acute flareups. 3.  Patient may return to full activity with no restrictions wearing good supportive shoes 4.  Patient may return to work full duty no restrictions beginning 09/29/2018 5.  Return to clinic as needed  Edrick Kins, DPM [facility]  Dr. Edrick Kins, [address]                                        [place], [place] [address]                Office [phone]  Fax [phone]

## 2018-10-27 DIAGNOSIS — Z6829 Body mass index (BMI) 29.0-29.9, adult: Secondary | ICD-10-CM | POA: Diagnosis not present

## 2018-10-27 DIAGNOSIS — F17290 Nicotine dependence, other tobacco product, uncomplicated: Secondary | ICD-10-CM | POA: Diagnosis not present

## 2018-10-27 DIAGNOSIS — E663 Overweight: Secondary | ICD-10-CM | POA: Diagnosis not present

## 2018-10-27 DIAGNOSIS — I10 Essential (primary) hypertension: Secondary | ICD-10-CM | POA: Diagnosis not present

## 2018-10-27 DIAGNOSIS — Z1389 Encounter for screening for other disorder: Secondary | ICD-10-CM | POA: Diagnosis not present

## 2018-11-24 ENCOUNTER — Other Ambulatory Visit: Payer: Self-pay

## 2018-11-24 ENCOUNTER — Other Ambulatory Visit: Payer: BLUE CROSS/BLUE SHIELD

## 2018-11-24 DIAGNOSIS — Z20822 Contact with and (suspected) exposure to covid-19: Secondary | ICD-10-CM

## 2018-11-24 DIAGNOSIS — R6889 Other general symptoms and signs: Secondary | ICD-10-CM | POA: Diagnosis not present

## 2018-11-27 LAB — NOVEL CORONAVIRUS, NAA: SARS-CoV-2, NAA: NOT DETECTED

## 2019-07-01 ENCOUNTER — Ambulatory Visit (INDEPENDENT_AMBULATORY_CARE_PROVIDER_SITE_OTHER): Payer: BC Managed Care – PPO | Admitting: Podiatry

## 2019-07-01 ENCOUNTER — Ambulatory Visit (INDEPENDENT_AMBULATORY_CARE_PROVIDER_SITE_OTHER): Payer: BC Managed Care – PPO

## 2019-07-01 ENCOUNTER — Encounter: Payer: Self-pay | Admitting: Podiatry

## 2019-07-01 ENCOUNTER — Other Ambulatory Visit: Payer: Self-pay

## 2019-07-01 DIAGNOSIS — M79672 Pain in left foot: Secondary | ICD-10-CM | POA: Diagnosis not present

## 2019-07-01 DIAGNOSIS — S92355A Nondisplaced fracture of fifth metatarsal bone, left foot, initial encounter for closed fracture: Secondary | ICD-10-CM

## 2019-07-05 NOTE — Progress Notes (Signed)
   HPI: 59 y.o. female presenting today with a chief complaint of constant severe stabbing and burning pain to the left dorsolateral foot that began 5 days ago secondary to an injury. She states she was walking when she suddenly felt a popping sensation in the foot and began to experience severe pain. Walking increases this pain. She has been icing the area, wearing compression socks and shoes with arch supports. Patient is here for further evaluation and treatment.   Past Medical History:  Diagnosis Date  . Hypertension      Physical Exam: General: The patient is alert and oriented x3 in no acute distress.  Dermatology: Skin is warm, dry and supple bilateral lower extremities. Negative for open lesions or macerations.  Vascular: Palpable pedal pulses bilaterally. No erythema noted. Capillary refill within normal limits.  Neurological: Epicritic and protective threshold grossly intact bilaterally.   Musculoskeletal Exam: Pain with palpation and edema noted to the 4th and 5th metatarsals of the left foot. Range of motion within normal limits to all pedal and ankle joints bilateral. Muscle strength 5/5 in all groups bilateral.   Radiographic Exam:  Closed, nondisplaced callus formation noted around the 4th metatarsal fracture of the left foot. Fracture of the 5th metatarsal diaphysis noted.    Assessment: 1. Fracture 4th and 5th metatarsal diaphysis left    Plan of Care:  1. Patient evaluated. X-Rays reviewed.  2. CAM boot dispensed.  3. Ace wraps dispensed.  4. Recommended OTC Motrin as needed.  5. Patient can only take one week off work. Note provided.  6. Return to clinic in 6 weeks for follow up X-Rays.  Works standing on concrete floors all day.      Edrick Kins, DPM [facility]  Dr. Edrick Kins, DPM    [address], [place] [address]                Office [phone]  Fax [phone]

## 2019-07-06 ENCOUNTER — Other Ambulatory Visit: Payer: Self-pay | Admitting: Podiatry

## 2019-07-06 DIAGNOSIS — S92355A Nondisplaced fracture of fifth metatarsal bone, left foot, initial encounter for closed fracture: Secondary | ICD-10-CM

## 2019-07-24 DIAGNOSIS — I10 Essential (primary) hypertension: Secondary | ICD-10-CM | POA: Diagnosis not present

## 2019-07-24 DIAGNOSIS — Z1389 Encounter for screening for other disorder: Secondary | ICD-10-CM | POA: Diagnosis not present

## 2019-07-24 DIAGNOSIS — Z1151 Encounter for screening for human papillomavirus (HPV): Secondary | ICD-10-CM | POA: Diagnosis not present

## 2019-07-24 DIAGNOSIS — E663 Overweight: Secondary | ICD-10-CM | POA: Diagnosis not present

## 2019-07-24 DIAGNOSIS — Z01419 Encounter for gynecological examination (general) (routine) without abnormal findings: Secondary | ICD-10-CM | POA: Diagnosis not present

## 2019-07-24 DIAGNOSIS — Z124 Encounter for screening for malignant neoplasm of cervix: Secondary | ICD-10-CM | POA: Diagnosis not present

## 2019-07-24 DIAGNOSIS — Z6827 Body mass index (BMI) 27.0-27.9, adult: Secondary | ICD-10-CM | POA: Diagnosis not present

## 2019-07-28 ENCOUNTER — Encounter: Payer: Self-pay | Admitting: *Deleted

## 2019-07-30 ENCOUNTER — Other Ambulatory Visit: Payer: Self-pay | Admitting: Internal Medicine

## 2019-07-30 DIAGNOSIS — Z1231 Encounter for screening mammogram for malignant neoplasm of breast: Secondary | ICD-10-CM

## 2019-08-12 ENCOUNTER — Ambulatory Visit (INDEPENDENT_AMBULATORY_CARE_PROVIDER_SITE_OTHER): Payer: BC Managed Care – PPO

## 2019-08-12 ENCOUNTER — Ambulatory Visit (INDEPENDENT_AMBULATORY_CARE_PROVIDER_SITE_OTHER): Payer: BC Managed Care – PPO | Admitting: Podiatry

## 2019-08-12 ENCOUNTER — Other Ambulatory Visit: Payer: Self-pay

## 2019-08-12 DIAGNOSIS — S92345D Nondisplaced fracture of fourth metatarsal bone, left foot, subsequent encounter for fracture with routine healing: Secondary | ICD-10-CM | POA: Diagnosis not present

## 2019-08-12 DIAGNOSIS — M7672 Peroneal tendinitis, left leg: Secondary | ICD-10-CM | POA: Diagnosis not present

## 2019-08-12 DIAGNOSIS — S92355D Nondisplaced fracture of fifth metatarsal bone, left foot, subsequent encounter for fracture with routine healing: Secondary | ICD-10-CM | POA: Diagnosis not present

## 2019-08-12 MED ORDER — MELOXICAM 15 MG PO TABS: 15.0000 mg | ORAL_TABLET | Freq: Every day | ORAL | 1 refills | Status: DC

## 2019-08-19 NOTE — Progress Notes (Signed)
   HPI: 59 y.o. female presenting today for follow-up evaluation of fractures of the fourth and fifth metatarsal diaphysis left foot.  Patient states that she is feeling much better.  She feels that the fractures are improving and she is noticing a reduction in pain. The patient does have a new complaint today regarding pain to the lateral aspect of the left ankle.  Patient states that she has noticed constant pain to the left ankle over the past several weeks.  She denies injury.  Past Medical History:  Diagnosis Date  . Hypertension      Physical Exam: General: The patient is alert and oriented x3 in no acute distress.  Dermatology: Skin is warm, dry and supple bilateral lower extremities. Negative for open lesions or macerations.  Vascular: Palpable pedal pulses bilaterally. No erythema noted. Capillary refill within normal limits.  Neurological: Epicritic and protective threshold grossly intact bilaterally.   Musculoskeletal Exam: Improved pain with palpation and edema noted to the 4th and 5th metatarsals of the left foot. Range of motion within normal limits to all pedal and ankle joints bilateral. Muscle strength 5/5 in all groups bilateral.  There is also pain on palpation and range of motion along the peroneal tendons left lateral ankle just posterior to the lateral malleolus  Radiographic Exam:  Closed, nondisplaced callus formation noted around the 4th and fifth metatarsal fracture of the left foot with osseous callus restructuring and indications of routine healing.   Assessment: 1. Fracture 4th and 5th metatarsal diaphysis left with routine healing 2.  Peroneal tendinitis left   Plan of Care:  1. Patient evaluated. X-Rays reviewed.  2.  Injection of 0.5 cc Celestone Soluspan injected along the peroneal tendon sheath left 3.  Ankle brace dispensed.  Patient may discontinue the cam boot. 4.  Prescription for meloxicam 15 mg daily 5.  Return to clinic as needed or if  symptoms do not resolve  Works standing on concrete floors all day.      Edrick Kins, DPM [facility]  Dr. Edrick Kins, DPM    [address], [place] [address]                Office [phone]  Fax [phone]

## 2019-09-16 ENCOUNTER — Ambulatory Visit (INDEPENDENT_AMBULATORY_CARE_PROVIDER_SITE_OTHER): Payer: BC Managed Care – PPO | Admitting: Podiatry

## 2019-09-16 ENCOUNTER — Ambulatory Visit (INDEPENDENT_AMBULATORY_CARE_PROVIDER_SITE_OTHER): Payer: BC Managed Care – PPO

## 2019-09-16 ENCOUNTER — Other Ambulatory Visit: Payer: Self-pay

## 2019-09-16 DIAGNOSIS — S92355D Nondisplaced fracture of fifth metatarsal bone, left foot, subsequent encounter for fracture with routine healing: Secondary | ICD-10-CM

## 2019-09-16 DIAGNOSIS — M7672 Peroneal tendinitis, left leg: Secondary | ICD-10-CM | POA: Diagnosis not present

## 2019-09-16 MED ORDER — MELOXICAM 15 MG PO TABS: 15.0000 mg | ORAL_TABLET | Freq: Every day | ORAL | 3 refills | Status: DC

## 2019-09-20 NOTE — Progress Notes (Signed)
   HPI: 59 y.o. female presenting today for follow-up evaluation of fractures of the fourth and fifth metatarsal diaphysis and peroneal tendinitis left. She states the injection and the Meloxicam have helped ease her symptoms. She has been using the ankle brace as well which helps alleviate the pain. There are no worsening factors noted. Patient is here for further evaluation and treatment.   Past Medical History:  Diagnosis Date  . Hypertension      Physical Exam: General: The patient is alert and oriented x3 in no acute distress.  Dermatology: Skin is warm, dry and supple bilateral lower extremities. Negative for open lesions or macerations.  Vascular: Palpable pedal pulses bilaterally. No erythema noted. Capillary refill within normal limits.  Neurological: Epicritic and protective threshold grossly intact bilaterally.   Musculoskeletal Exam: Improved pain with palpation and edema noted to the 4th and 5th metatarsals of the left foot. Range of motion within normal limits to all pedal and ankle joints bilateral. Muscle strength 5/5 in all groups bilateral.  There is also pain on palpation and range of motion along the peroneal tendons left lateral ankle just posterior to the lateral malleolus  Radiographic Exam:  Closed, nondisplaced callus formation noted around the 4th and fifth metatarsal fracture of the left foot with osseous callus restructuring and indications of routine healing.   Assessment: 1. Fracture 4th and 5th metatarsal diaphysis left with routine healing 2. Peroneal tendinitis left   Plan of Care:  1. Patient evaluated. X-Rays reviewed.  2. Injection of 0.5 cc Celestone Soluspan injected along the peroneal tendon sheath left 3. Refill prescription for Meloxicam provided to patient.  4. Continue using ankle brace.  5. Return to clinic as needed.   Works standing on concrete floors all day.      Edrick Kins, DPM [facility]  Dr. Edrick Kins, DPM    [address], [place] [address]                Office [phone]  Fax [phone]

## 2019-09-30 ENCOUNTER — Ambulatory Visit (INDEPENDENT_AMBULATORY_CARE_PROVIDER_SITE_OTHER): Payer: Self-pay | Admitting: *Deleted

## 2019-09-30 ENCOUNTER — Other Ambulatory Visit: Payer: Self-pay

## 2019-09-30 DIAGNOSIS — Z1211 Encounter for screening for malignant neoplasm of colon: Secondary | ICD-10-CM

## 2019-09-30 NOTE — Progress Notes (Signed)
Ok to schedule.

## 2019-09-30 NOTE — Progress Notes (Signed)
Gastroenterology Pre-Procedure Review  Request Date: 09/30/2019 Requesting Physician: Dr. Gerarda Fraction, no previous TCS  PATIENT REVIEW QUESTIONS: The patient responded to the following health history questions as indicated:    1. Diabetes Melitis: no 2. Joint replacements in the past 12 months: no, but foot surgery 1 year ago, bone spur removal 3. Major health problems in the past 3 months: no 4. Has an artificial valve or MVP: no 5. Has a defibrillator: no 6. Has been advised in past to take antibiotics in advance of a procedure like teeth cleaning: no 7. Family history of colon cancer: no  8. Alcohol Use: yes, 1 glass of wine nightly 9. Illicit drug Use: no 10. History of sleep apnea: no  11. History of coronary artery or other vascular stents placed within the last 12 months: no 12. History of any prior anesthesia complications: no 13. There is no height or weight on file to calculate BMI. ht: 5'7 wt: 182 lbs    MEDICATIONS & ALLERGIES:    Patient reports the following regarding taking any blood thinners:   Plavix? no Aspirin? no Coumadin? no Brilinta? no Xarelto? no Eliquis? no Pradaxa? no Savaysa? no Effient? no  Patient confirms/reports the following medications:  Current Outpatient Medications  Medication Sig Dispense Refill  . amLODipine (NORVASC) 10 MG tablet Take 1 tablet (10 mg total) by mouth daily. Please schedule appt for future refills thank you. 30 tablet 0  . diclofenac (VOLTAREN) 75 MG EC tablet Take 1 tablet (75 mg total) by mouth 2 (two) times daily. 60 tablet 1  . furosemide (LASIX) 20 MG tablet Take 20 mg by mouth as needed. Taking PRN    . lisinopril (ZESTRIL) 40 MG tablet Take 40 mg by mouth daily.    Marland Kitchen zolpidem (AMBIEN) 10 MG tablet Take 10 mg by mouth as needed for sleep.     No current facility-administered medications for this visit.    Patient confirms/reports the following allergies:  No Known Allergies  No orders of the defined types were placed  in this encounter.   AUTHORIZATION INFORMATION Primary Insurance: [place],  [place] #: [id number],  Group [id number] Pre-Cert / Auth required: No, not required  SCHEDULE INFORMATION: Procedure has been scheduled as follows:  Date: 12/04/2019, Time: 12:00 Location: [facility] with Dr. Gala Romney  This Gastroenterology Pre-Precedure Review Form is being routed to the following provider(s): Neil Crouch, PA

## 2019-09-30 NOTE — Patient Instructions (Addendum)
Gastroenterology Pre-Procedure Review   Joy Alvarado   11/18/1960 MRN: [id number]    Procedure Date: 12/04/2019 Time to register: 11:00 am Place to register: [facility] Procedure Time: 12:00 pm Scheduled provider: Dr. Gala Romney  PREPARATION FOR COLONOSCOPY WITH TRI-LYTE SPLIT PREP  Please notify us immediately if you are diabetic, take iron supplements, or if you are on Coumadin or any other blood thinners.   Please hold the following medications: n/a  You will need to purchase 1 fleet enema and 1 box of Bisacodyl 29m tablets.   2 DAYS BEFORE PROCEDURE:  DATE: 12/02/2019   DAY: Wednesday Begin clear liquid diet AFTER your lunch meal. NO SOLID FOODS after this point.  1 DAY BEFORE PROCEDURE:  DATE: 12/03/2019  DAY: Thursday Continue clear liquids the entire day - NO SOLID FOOD.   Diabetic medications adjustments for today: n/a  At 2:00 pm:  Take 2 Bisacodyl tablets.   At 4:00pm:  Start drinking your solution. Make sure you mix well per instructions on the bottle. Try to drink 1 (one) 8 ounce glass every 10-15 minutes until you have consumed HALF the jug. You should complete by 6:00pm.You must keep the left over solution refrigerated until completed next day.  Continue clear liquids. You must drink plenty of clear liquids to prevent dehyration and kidney failure.     DAY OF PROCEDURE:   DATE: 12/04/2019 DAY: Friday If you take medications for your heart, blood pressure or breathing, you may take these medications.  Diabetic medications adjustments for today: n/a  Five hours before your procedure time @ 7:00 am:  Finish remaining amout of bowel prep, drinking 1 (one) 8 ounce glass every 10-15 minutes until complete. You have two hours to consume remaining prep.   Three hours before your procedure time @ 9:00 am:  Nothing by mouth.   At least one hour before going to the hospital:  Give yourself one Fleet enema. You may take your morning medications with sip of water  unless we have instructed otherwise.      Please see below for Dietary Information.  CLEAR LIQUIDS INCLUDE:  Water Jello (NOT red in color)   Ice Popsicles (NOT red in color)   Tea (sugar ok, no milk/cream) Powdered fruit flavored drinks  Coffee (sugar ok, no milk/cream) Gatorade/ Lemonade/ Kool-Aid  (NOT red in color)   Juice: apple, white grape, white cranberry Soft drinks  Clear bullion, consomme, broth (fat free beef/chicken/vegetable)  Carbonated beverages (any kind)  Strained chicken noodle soup Hard Candy   Remember: Clear liquids are liquids that will allow you to see your fingers on the other side of a clear glass. Be sure liquids are NOT red in color, and not cloudy, but CLEAR.  DO NOT EAT OR DRINK ANY OF THE FOLLOWING:  Dairy products of any kind   Cranberry juice Tomato juice / V8 juice   Grapefruit juice Orange juice     Red grape juice  Do not eat any solid foods, including such foods as: cereal, oatmeal, yogurt, fruits, vegetables, creamed soups, eggs, bread, crackers, pureed foods in a blender, etc.   HELPFUL HINTS FOR DRINKING PREP SOLUTION:   Make sure prep is extremely cold. Mix and refrigerate the the morning of the prep. You may also put in the freezer.   You may try mixing some Crystal Light or Country Time Lemonade if you prefer. Mix in small amounts; add more if necessary.  Try drinking through a straw  Rinse mouth  with water or a mouthwash between glasses, to remove after-taste.  Try sipping on a cold beverage /ice/ popsicles between glasses of prep.  Place a piece of sugar-free hard candy in mouth between glasses.  If you become nauseated, try consuming smaller amounts, or stretch out the time between glasses. Stop for 30-60 minutes, then slowly start back drinking.        OTHER INSTRUCTIONS  You will need a responsible adult at least 58 years of age to accompany you and drive you home. This person must remain in the waiting room during your  procedure. The hospital will cancel your procedure if you do not have a responsible adult with you.   1. Wear loose fitting clothing that is easily removed. 2. Leave jewelry and other valuables at home.  3. Remove all body piercing jewelry and leave at home. 4. Total time from sign-in until discharge is approximately 2-3 hours. 5. You should go home directly after your procedure and rest. You can resume normal activities the day after your procedure. 6. The day of your procedure you should not:  Drive  Make legal decisions  Operate machinery  Drink alcohol  Return to work   You may call the office (Dept: [phone]) before 5:00pm, or page the doctor on call [phone]) after 5:00pm, for further instructions, if necessary.   Insurance Information YOU WILL NEED TO CHECK WITH YOUR INSURANCE COMPANY FOR THE BENEFITS OF COVERAGE YOU HAVE FOR THIS PROCEDURE.  UNFORTUNATELY, NOT ALL INSURANCE COMPANIES HAVE BENEFITS TO COVER ALL OR PART OF THESE TYPES OF PROCEDURES.  IT IS YOUR RESPONSIBILITY TO CHECK YOUR BENEFITS, HOWEVER, WE WILL BE GLAD TO ASSIST YOU WITH ANY CODES YOUR INSURANCE COMPANY MAY NEED.    PLEASE NOTE THAT MOST INSURANCE COMPANIES WILL NOT COVER A SCREENING COLONOSCOPY FOR PEOPLE UNDER THE AGE OF 50  IF YOU HAVE BCBS INSURANCE, YOU MAY HAVE BENEFITS FOR A SCREENING COLONOSCOPY BUT IF POLYPS ARE FOUND THE DIAGNOSIS WILL CHANGE AND THEN YOU MAY HAVE A DEDUCTIBLE THAT WILL NEED TO BE MET. SO PLEASE MAKE SURE YOU CHECK YOUR BENEFITS FOR A SCREENING COLONOSCOPY AS WELL AS A DIAGNOSTIC COLONOSCOPY.

## 2019-11-18 DIAGNOSIS — M25562 Pain in left knee: Secondary | ICD-10-CM | POA: Diagnosis not present

## 2019-11-18 DIAGNOSIS — S83422A Sprain of lateral collateral ligament of left knee, initial encounter: Secondary | ICD-10-CM | POA: Diagnosis not present

## 2019-11-30 ENCOUNTER — Telehealth: Payer: Self-pay | Admitting: *Deleted

## 2019-11-30 ENCOUNTER — Other Ambulatory Visit: Payer: Self-pay | Admitting: *Deleted

## 2019-11-30 MED ORDER — PEG 3350-KCL-NA BICARB-NACL 420 G PO SOLR: 4000.0000 mL | Freq: Once | ORAL | 0 refills | Status: AC

## 2019-11-30 NOTE — Telephone Encounter (Signed)
Pt's husband called in and said that pt wanted [place] to send in RX for Trilyte.  Called [facility] [address] and confirmed that they do have it in stock.  Will fax prep instructions to pharmacy as well.  Called pt back and informed her.

## 2019-12-02 ENCOUNTER — Other Ambulatory Visit (HOSPITAL_COMMUNITY): Payer: BC Managed Care – PPO

## 2019-12-02 ENCOUNTER — Other Ambulatory Visit (HOSPITAL_COMMUNITY)
Admission: RE | Admit: 2019-12-02 | Discharge: 2019-12-02 | Disposition: A | Discharge status: Home / Self Care | Payer: BC Managed Care – PPO | Source: Ambulatory Visit | Attending: Internal Medicine | Admitting: Internal Medicine

## 2019-12-02 ENCOUNTER — Other Ambulatory Visit: Payer: Self-pay

## 2019-12-02 DIAGNOSIS — Z20822 Contact with and (suspected) exposure to covid-19: Secondary | ICD-10-CM | POA: Diagnosis not present

## 2019-12-02 DIAGNOSIS — Z01812 Encounter for preprocedural laboratory examination: Secondary | ICD-10-CM | POA: Diagnosis not present

## 2019-12-03 LAB — SARS CORONAVIRUS 2 (TAT 6-24 HRS): SARS Coronavirus 2: NEGATIVE

## 2019-12-04 ENCOUNTER — Ambulatory Visit (HOSPITAL_COMMUNITY)
Admission: RE | Admit: 2019-12-04 | Discharge: 2019-12-04 | Disposition: A | Discharge status: Home / Self Care | Payer: BC Managed Care – PPO | Attending: Internal Medicine | Admitting: Internal Medicine

## 2019-12-04 ENCOUNTER — Other Ambulatory Visit: Payer: Self-pay

## 2019-12-04 ENCOUNTER — Encounter (HOSPITAL_COMMUNITY): Payer: Self-pay | Admitting: Internal Medicine

## 2019-12-04 ENCOUNTER — Encounter (HOSPITAL_COMMUNITY)
Admission: RE | Disposition: A | Discharge status: Home / Self Care | Payer: Self-pay | Source: Home / Self Care | Attending: Internal Medicine

## 2019-12-04 DIAGNOSIS — K635 Polyp of colon: Secondary | ICD-10-CM

## 2019-12-04 DIAGNOSIS — D122 Benign neoplasm of ascending colon: Secondary | ICD-10-CM | POA: Diagnosis not present

## 2019-12-04 DIAGNOSIS — F17210 Nicotine dependence, cigarettes, uncomplicated: Secondary | ICD-10-CM | POA: Insufficient documentation

## 2019-12-04 DIAGNOSIS — Z79899 Other long term (current) drug therapy: Secondary | ICD-10-CM | POA: Insufficient documentation

## 2019-12-04 DIAGNOSIS — Z791 Long term (current) use of non-steroidal anti-inflammatories (NSAID): Secondary | ICD-10-CM | POA: Diagnosis not present

## 2019-12-04 DIAGNOSIS — D124 Benign neoplasm of descending colon: Secondary | ICD-10-CM | POA: Diagnosis not present

## 2019-12-04 DIAGNOSIS — Z1211 Encounter for screening for malignant neoplasm of colon: Secondary | ICD-10-CM | POA: Insufficient documentation

## 2019-12-04 DIAGNOSIS — I10 Essential (primary) hypertension: Secondary | ICD-10-CM | POA: Insufficient documentation

## 2019-12-04 DIAGNOSIS — D120 Benign neoplasm of cecum: Secondary | ICD-10-CM | POA: Diagnosis not present

## 2019-12-04 HISTORY — PX: COLONOSCOPY: SHX5424

## 2019-12-04 HISTORY — PX: POLYPECTOMY: SHX5525

## 2019-12-04 SURGERY — COLONOSCOPY
Anesthesia: Moderate Sedation

## 2019-12-04 MED ORDER — ONDANSETRON HCL 4 MG/2ML IJ SOLN
INTRAMUSCULAR | Status: DC | PRN
  Administered 2019-12-04: 4 mg via INTRAVENOUS

## 2019-12-04 MED ORDER — MEPERIDINE HCL 50 MG/ML IJ SOLN
INTRAMUSCULAR | Status: AC
  Filled 2019-12-04: qty 1

## 2019-12-04 MED ORDER — ONDANSETRON HCL 4 MG/2ML IJ SOLN
INTRAMUSCULAR | Status: AC
  Filled 2019-12-04: qty 2

## 2019-12-04 MED ORDER — MIDAZOLAM HCL 5 MG/5ML IJ SOLN
INTRAMUSCULAR | Status: AC
  Filled 2019-12-04: qty 10

## 2019-12-04 MED ORDER — MEPERIDINE HCL 100 MG/ML IJ SOLN
INTRAMUSCULAR | Status: DC | PRN
  Administered 2019-12-04: 15 mg via INTRAVENOUS
  Administered 2019-12-04: 25 mg via INTRAVENOUS
  Administered 2019-12-04: 10 mg via INTRAVENOUS

## 2019-12-04 MED ORDER — STERILE WATER FOR IRRIGATION IR SOLN
Status: DC | PRN
  Administered 2019-12-04: 1.5 mL

## 2019-12-04 MED ORDER — MIDAZOLAM HCL 5 MG/5ML IJ SOLN
INTRAMUSCULAR | Status: DC | PRN
  Administered 2019-12-04 (×3): 2 mg via INTRAVENOUS
  Administered 2019-12-04: 1 mg via INTRAVENOUS

## 2019-12-04 MED ORDER — SODIUM CHLORIDE 0.9 % IV SOLN: INTRAVENOUS | Status: DC

## 2019-12-04 NOTE — H&P (Signed)
@LOGO @   Primary Care Physician:  Redmond School, MD Primary Gastroenterologist:  Dr. Gala Romney  Pre-Procedure History & Physical: HPI:  Joy Alvarado is a 59 y.o. female is here for a screening colonoscopy.  No bowel symptoms.  No family history of colon cancer.  No prior colonoscopy.  Past Medical History:  Diagnosis Date  . Hypertension     Past Surgical History:  Procedure Laterality Date  . APPENDECTOMY    . CESAREAN SECTION    . FOOT SURGERY    . TUBAL LIGATION      Prior to Admission medications   Medication Sig Start Date End Date Taking? Authorizing Provider  amLODipine (NORVASC) 10 MG tablet Take 1 tablet (10 mg total) by mouth daily. Please schedule appt for future refills thank you. Patient taking differently: Take 10 mg by mouth daily.  12/11/17  Yes Josue Hector, MD  cholecalciferol (VITAMIN D3) 25 MCG (1000 UNIT) tablet Take 1,000 Units by mouth daily.   Yes [provider]  lisinopril (ZESTRIL) 40 MG tablet Take 40 mg by mouth daily.   Yes [provider]  meloxicam (MOBIC) 15 MG tablet Take 15 mg by mouth daily as needed for pain. 10/04/19  Yes [provider]  Potassium Gluconate 550 MG TABS Take 550 mg by mouth daily.   Yes [provider]  zinc gluconate 50 MG tablet Take 50 mg by mouth daily.   Yes [provider]  diclofenac (VOLTAREN) 75 MG EC tablet Take 1 tablet (75 mg total) by mouth 2 (two) times daily. Patient not taking: Reported on 11/24/2019 05/07/18   Edrick Kins, DPM  zolpidem (AMBIEN) 10 MG tablet Take 10 mg by mouth at bedtime as needed for sleep.     [provider]    Allergies as of 10/01/2019  . (No Known Allergies)    History reviewed. No pertinent family history.  Social History   Socioeconomic History  . Marital status: Married    Spouse name: Not on file  . Number of children: Not on file  . Years of education: Not on file  . Highest education level: Not on file   Occupational History  . Not on file  Tobacco Use  . Smoking status: Current Every Day Smoker    Packs/day: 1.00    Types: Cigarettes  . Smokeless tobacco: Never Used  Vaping Use  . Vaping Use: Never used  Substance and Sexual Activity  . Alcohol use: Yes    Alcohol/week: 0.0 standard drinks    Comment: glass wine daily  . Drug use: No  . Sexual activity: Not on file  Other Topics Concern  . Not on file  Social History Narrative  . Not on file   Social Determinants of Health   Financial Resource Strain:   . Difficulty of Paying Living Expenses:   Food Insecurity:   . Worried About Charity fundraiser in the Last Year:   . Arboriculturist in the Last Year:   Transportation Needs:   . Film/video editor (Medical):   Marland Kitchen Lack of Transportation (Non-Medical):   Physical Activity:   . Days of Exercise per Week:   . Minutes of Exercise per Session:   Stress:   . Feeling of Stress :   Social Connections:   . Frequency of Communication with Friends and Family:   . Frequency of Social Gatherings with Friends and Family:   . Attends Religious Services:   . Active  Member of Clubs or Organizations:   . Attends Archivist Meetings:   Marland Kitchen Marital Status:   Intimate Partner Violence:   . Fear of Current or Ex-Partner:   . Emotionally Abused:   Marland Kitchen Physically Abused:   . Sexually Abused:     Review of Systems: See HPI, otherwise negative ROS  Physical Exam: BP (!) 153/64   Pulse 69   Temp 98.1 F (36.7 C) (Oral)   Resp 14   Ht 5\' 7"  (1.702 m)   Wt 78 kg   LMP  (LMP Unknown)   SpO2 96%   BMI 26.94 kg/m  General:   Alert,  Well-developed, well-nourished, pleasant and cooperative in NAD Lungs:  Clear throughout to auscultation.   No wheezes, crackles, or rhonchi. No acute distress. Heart:  Regular rate and rhythm; no murmurs, clicks, rubs,  or gallops. Abdomen:  Soft, nontender and nondistended. No masses, hepatosplenomegaly or hernias noted. Normal bowel  sounds, without guarding, and without rebound.   Impression/Plan: Joy Alvarado is now here to undergo a screening colonoscopy.  First ever average risk screening examination.  Risks, benefits, limitations, imponderables and alternatives regarding colonoscopy have been reviewed with the patient. Questions have been answered. All parties agreeable.     Notice:  This dictation was prepared with Dragon dictation along with smaller phrase technology. Any transcriptional errors that result from this process are unintentional and may not be corrected upon review.

## 2019-12-04 NOTE — Discharge Instructions (Signed)
Colonoscopy Discharge Instructions  Read the instructions outlined below and refer to this sheet in the next few weeks. These discharge instructions provide you with general information on caring for yourself after you leave the hospital. Your doctor may also give you specific instructions. While your treatment has been planned according to the most current medical practices available, unavoidable complications occasionally occur. If you have any problems or questions after discharge, call Dr. Gala Romney at [phone]. ACTIVITY  You may resume your regular activity, but move at a slower pace for the next 24 hours.   Take frequent rest periods for the next 24 hours.   Walking will help get rid of the air and reduce the bloated feeling in your belly (abdomen).   No driving for 24 hours (because of the medicine (anesthesia) used during the test).    Do not sign any important legal documents or operate any machinery for 24 hours (because of the anesthesia used during the test).  NUTRITION  Drink plenty of fluids.   You may resume your normal diet as instructed by your doctor.   Begin with a light meal and progress to your normal diet. Heavy or fried foods are harder to digest and may make you feel sick to your stomach (nauseated).   Avoid alcoholic beverages for 24 hours or as instructed.  MEDICATIONS  You may resume your normal medications unless your doctor tells you otherwise.  WHAT YOU CAN EXPECT TODAY  Some feelings of bloating in the abdomen.   Passage of more gas than usual.   Spotting of blood in your stool or on the toilet paper.  IF YOU HAD POLYPS REMOVED DURING THE COLONOSCOPY:  No aspirin products for 7 days or as instructed.   No alcohol for 7 days or as instructed.   Eat a soft diet for the next 24 hours.  FINDING OUT THE RESULTS OF YOUR TEST Not all test results are available during your visit. If your test results are not back during the visit, make an appointment  with your caregiver to find out the results. Do not assume everything is normal if you have not heard from your caregiver or the medical facility. It is important for you to follow up on all of your test results.  SEEK IMMEDIATE MEDICAL ATTENTION IF:  You have more than a spotting of blood in your stool.   Your belly is swollen (abdominal distention).   You are nauseated or vomiting.   You have a temperature over 101.   You have abdominal pain or discomfort that is severe or gets worse throughout the day.    Colon polyp information provided  2 polyps removed from your colon today  Further recommendations to follow pending review of pathology report  At patient request I called Fred at [phone] and reviewed results  PATIENT INSTRUCTIONS POST-ANESTHESIA  IMMEDIATELY FOLLOWING SURGERY:  Do not drive or operate machinery for the first twenty four hours after surgery.  Do not make any important decisions for twenty four hours after surgery or while taking narcotic pain medications or sedatives.  If you develop intractable nausea and vomiting or a severe headache please notify your doctor immediately.  FOLLOW-UP:  Please make an appointment with your surgeon as instructed. You do not need to follow up with anesthesia unless specifically instructed to do so.  WOUND CARE INSTRUCTIONS (if applicable):  Keep a dry clean dressing on the anesthesia/puncture wound site if there is drainage.  Once the wound has quit draining  you may leave it open to air.  Generally you should leave the bandage intact for twenty four hours unless there is drainage.  If the epidural site drains for more than 36-48 hours please call the anesthesia department.  QUESTIONS?:  Please feel free to call your physician or the hospital operator if you have any questions, and they will be happy to assist you.    Colon Polyps  Polyps are tissue growths inside the body. Polyps can grow in many places, including the large  intestine (colon). A polyp may be a round bump or a mushroom-shaped growth. You could have one polyp or several. Most colon polyps are noncancerous (benign). However, some colon polyps can become cancerous over time. Finding and removing the polyps early can help prevent this. What are the causes? The exact cause of colon polyps is not known. What increases the risk? You are more likely to develop this condition if you:  Have a family history of colon cancer or colon polyps.  Are older than 86 or older than 45 if you are African American.  Have inflammatory bowel disease, such as ulcerative colitis or Crohn's disease.  Have certain hereditary conditions, such as: ? Familial adenomatous polyposis. ? Lynch syndrome. ? Turcot syndrome. ? Peutz-Jeghers syndrome.  Are overweight.  Smoke cigarettes.  Do not get enough exercise.  Drink too much alcohol.  Eat a diet that is high in fat and red meat and low in fiber.  Had childhood cancer that was treated with abdominal radiation. What are the signs or symptoms? Most polyps do not cause symptoms. If you have symptoms, they may include:  Blood coming from your rectum when having a bowel movement.  Blood in your stool. The stool may look dark red or black.  Abdominal pain.  A change in bowel habits, such as constipation or diarrhea. How is this diagnosed? This condition is diagnosed with a colonoscopy. This is a procedure in which a lighted, flexible scope is inserted into the anus and then passed into the colon to examine the area. Polyps are sometimes found when a colonoscopy is done as part of routine cancer screening tests. How is this treated? Treatment for this condition involves removing any polyps that are found. Most polyps can be removed during a colonoscopy. Those polyps will then be tested for cancer. Additional treatment may be needed depending on the results of testing. Follow these instructions at  home: Lifestyle  Maintain a healthy weight, or lose weight if recommended by your health care provider.  Exercise every day or as told by your health care provider.  Do not use any products that contain nicotine or tobacco, such as cigarettes and e-cigarettes. If you need help quitting, ask your health care provider.  If you drink alcohol, limit how much you have: ? 0-1 drink a day for women. ? 0-2 drinks a day for men.  Be aware of how much alcohol is in your drink. In the [place] one drink equals one 12 oz bottle of beer (355 mL), one 5 oz glass of wine (148 mL), or one 1 oz shot of hard liquor (44 mL). Eating and drinking   Eat foods that are high in fiber, such as fruits, vegetables, and whole grains.  Eat foods that are high in calcium and vitamin D, such as milk, cheese, yogurt, eggs, liver, fish, and broccoli.  Limit foods that are high in fat, such as fried foods and desserts.  Limit the amount of red meat  and processed meat you eat, such as hot dogs, sausage, bacon, and lunch meats. General instructions  Keep all follow-up visits as told by your health care provider. This is important. ? This includes having regularly scheduled colonoscopies. ? Talk to your health care provider about when you need a colonoscopy. Contact a health care provider if:  You have new or worsening bleeding during a bowel movement.  You have new or increased blood in your stool.  You have a change in bowel habits.  You lose weight for no known reason. Summary  Polyps are tissue growths inside the body. Polyps can grow in many places, including the colon.  Most colon polyps are noncancerous (benign), but some can become cancerous over time.  This condition is diagnosed with a colonoscopy.  Treatment for this condition involves removing any polyps that are found. Most polyps can be removed during a colonoscopy. This information is not intended to replace advice given to you by your health  care provider. Make sure you discuss any questions you have with your health care provider. Document Revised: 09/12/2017 Document Reviewed: 09/12/2017 Elsevier Patient Education  [place].

## 2019-12-04 NOTE — Op Note (Addendum)
[facility] Patient Name: Joy Alvarado Procedure Date: 12/04/2019 11:03 AM MRN: [id number] Date of Birth: 10/17/1960 Attending MD: Norvel Richards , MD CSN: [id number] Age: 59 Admit Type: Outpatient Procedure:                Colonoscopy Indications:              Screening for colorectal malignant neoplasm Providers:                Norvel Richards, MD, Otis Peak B. Sharon Seller, RN,                            Nelma Rothman, Technician Referring MD:              Medicines:                Midazolam 7 mg IV, Meperidine 50 mg IV, Complications:            No immediate complications. Estimated Blood Loss:     Estimated blood loss was minimal. Procedure:                Pre-Anesthesia Assessment:                           - Prior to the procedure, a History and Physical                            was performed, and patient medications and                            allergies were reviewed. The patient's tolerance of                            previous anesthesia was also reviewed. The risks                            and benefits of the procedure and the sedation                            options and risks were discussed with the patient.                            All questions were answered, and informed consent                            was obtained. Prior Anticoagulants: The patient has                            taken no previous anticoagulant or antiplatelet                            agents. ASA Grade Assessment: II - A patient with                            mild systemic disease. After reviewing the risks  and benefits, the patient was deemed in                            satisfactory condition to undergo the procedure.                           After obtaining informed consent, the colonoscope                            was passed under direct vision. Throughout the                            procedure, the patient's blood pressure, pulse, and                             oxygen saturations were monitored continuously. The                            CF-HQ190L ([id number]) scope was introduced through                            the anus and advanced to the the cecum, identified                            by appendiceal orifice and ileocecal valve. The                            colonoscopy was performed without difficulty. The                            patient tolerated the procedure well. The quality                            of the bowel preparation was adequate. The                            ileocecal valve, appendiceal orifice, and rectum                            were photographed. The entire colon was well                            visualized. Scope In: 11:15:43 AM Scope Out: 11:31:09 AM Scope Withdrawal Time: 0 hours 10 minutes 23 seconds  Total Procedure Duration: 0 hours 15 minutes 26 seconds  Findings:      The perianal and digital rectal examinations were normal.      Two sessile polyps were found in the descending colon and ascending       colon. The polyps were 5 to 8 mm in size. These polyps were removed with       a cold snare. Resection and retrieval were complete. Estimated blood       loss was minimal.      The entire examined colon appeared normal on direct and retroflexion       views. Impression:               -  Two 5 to 8 mm polyps in the descending colon and                            in the ascending colon, removed with a cold snare.                            Resected and retrieved.                           - The entire examined colon is normal on direct and                            retroflexion views. Moderate Sedation:      Moderate (conscious) sedation was administered by the endoscopy nurse       and supervised by the endoscopist. The following parameters were       monitored: oxygen saturation, heart rate, blood pressure, respiratory       rate, EKG, adequacy of pulmonary ventilation, and response to  care. Recommendation:           - Patient has a contact number available for                            emergencies. The signs and symptoms of potential                            delayed complications were discussed with the                            patient. Return to normal activities tomorrow.                            Written discharge instructions were provided to the                            patient.                           - Resume previous diet.                           - Continue present medications.                           - Repeat colonoscopy date to be determined after                            pending pathology results are reviewed for                            surveillance.                           - Return to GI office (date not yet determined). Procedure Code(s):        --- Professional ---  45385, Colonoscopy, flexible; with removal of                            tumor(s), polyp(s), or other lesion(s) by snare                            technique Diagnosis Code(s):        --- Professional ---                           Z12.11, Encounter for screening for malignant                            neoplasm of colon                           K63.5, Polyp of colon CPT copyright 2019 American Medical Association. All rights reserved. The codes documented in this report are preliminary and upon coder review may  be revised to meet current compliance requirements. [name]. [name], MD Norvel Richards, MD 12/04/2019 2:26:15 PM This report has been signed electronically. Number of Addenda: 1 Addendum Number: 1   Addendum Date: 12/13/2019 3:50:20 PM      report state polyp removed from ascending colon; this polyp was actually       removed from the cecum [name]. Nilson Tabora, MD Norvel Richards, MD 12/13/2019 3:52:42 PM This report has been signed electronically.

## 2019-12-07 LAB — SURGICAL PATHOLOGY

## 2019-12-08 ENCOUNTER — Encounter (HOSPITAL_COMMUNITY): Payer: Self-pay | Admitting: Internal Medicine

## 2019-12-08 ENCOUNTER — Encounter: Payer: Self-pay | Admitting: Internal Medicine

## 2020-06-09 ENCOUNTER — Other Ambulatory Visit: Payer: Self-pay | Admitting: Internal Medicine

## 2020-06-09 DIAGNOSIS — Z1231 Encounter for screening mammogram for malignant neoplasm of breast: Secondary | ICD-10-CM

## 2020-07-12 DIAGNOSIS — G5602 Carpal tunnel syndrome, left upper limb: Secondary | ICD-10-CM | POA: Diagnosis not present

## 2020-08-02 DIAGNOSIS — Z6825 Body mass index (BMI) 25.0-25.9, adult: Secondary | ICD-10-CM | POA: Diagnosis not present

## 2020-08-02 DIAGNOSIS — Z0001 Encounter for general adult medical examination with abnormal findings: Secondary | ICD-10-CM | POA: Diagnosis not present

## 2020-08-02 DIAGNOSIS — I10 Essential (primary) hypertension: Secondary | ICD-10-CM | POA: Diagnosis not present

## 2020-08-02 DIAGNOSIS — Z1389 Encounter for screening for other disorder: Secondary | ICD-10-CM | POA: Diagnosis not present

## 2020-08-02 DIAGNOSIS — Z1331 Encounter for screening for depression: Secondary | ICD-10-CM | POA: Diagnosis not present

## 2020-08-02 DIAGNOSIS — Z23 Encounter for immunization: Secondary | ICD-10-CM | POA: Diagnosis not present

## 2020-08-02 DIAGNOSIS — Z634 Disappearance and death of family member: Secondary | ICD-10-CM | POA: Diagnosis not present

## 2020-08-30 DIAGNOSIS — G5602 Carpal tunnel syndrome, left upper limb: Secondary | ICD-10-CM | POA: Diagnosis not present

## 2020-10-26 ENCOUNTER — Other Ambulatory Visit: Payer: Self-pay

## 2020-10-26 ENCOUNTER — Emergency Department (HOSPITAL_COMMUNITY): Payer: BC Managed Care – PPO

## 2020-10-26 ENCOUNTER — Encounter (HOSPITAL_COMMUNITY): Payer: Self-pay | Admitting: Emergency Medicine

## 2020-10-26 ENCOUNTER — Emergency Department (HOSPITAL_COMMUNITY)
Admission: EM | Admit: 2020-10-26 | Discharge: 2020-10-26 | Disposition: A | Discharge status: Home / Self Care | Payer: BC Managed Care – PPO | Attending: Emergency Medicine | Admitting: Emergency Medicine

## 2020-10-26 DIAGNOSIS — Y92410 Unspecified street and highway as the place of occurrence of the external cause: Secondary | ICD-10-CM | POA: Diagnosis not present

## 2020-10-26 DIAGNOSIS — M62830 Muscle spasm of back: Secondary | ICD-10-CM | POA: Diagnosis not present

## 2020-10-26 DIAGNOSIS — Z79899 Other long term (current) drug therapy: Secondary | ICD-10-CM | POA: Diagnosis not present

## 2020-10-26 DIAGNOSIS — I10 Essential (primary) hypertension: Secondary | ICD-10-CM | POA: Insufficient documentation

## 2020-10-26 DIAGNOSIS — F17210 Nicotine dependence, cigarettes, uncomplicated: Secondary | ICD-10-CM | POA: Diagnosis not present

## 2020-10-26 DIAGNOSIS — M62838 Other muscle spasm: Secondary | ICD-10-CM | POA: Diagnosis not present

## 2020-10-26 DIAGNOSIS — R000 Tachycardia, unspecified: Secondary | ICD-10-CM | POA: Diagnosis not present

## 2020-10-26 DIAGNOSIS — M25551 Pain in right hip: Secondary | ICD-10-CM | POA: Diagnosis not present

## 2020-10-26 DIAGNOSIS — M533 Sacrococcygeal disorders, not elsewhere classified: Secondary | ICD-10-CM | POA: Diagnosis not present

## 2020-10-26 DIAGNOSIS — M25572 Pain in left ankle and joints of left foot: Secondary | ICD-10-CM | POA: Diagnosis not present

## 2020-10-26 DIAGNOSIS — M545 Low back pain, unspecified: Secondary | ICD-10-CM | POA: Diagnosis not present

## 2020-10-26 MED ORDER — CYCLOBENZAPRINE HCL 5 MG PO TABS: 5.0000 mg | ORAL_TABLET | Freq: Three times a day (TID) | ORAL | 0 refills | Status: AC | PRN

## 2020-10-26 MED ORDER — CYCLOBENZAPRINE HCL 5 MG PO TABS: 5.0000 mg | ORAL_TABLET | Freq: Three times a day (TID) | ORAL | 0 refills | Status: DC | PRN
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
## 2020-10-26 NOTE — ED Notes (Signed)
Patient verbalizes understanding of discharge instructions. Opportunity for questioning and answers were provided. Armband removed by staff, pt discharged from ED and ambulated to lobby to return home.   

## 2020-10-26 NOTE — Discharge Instructions (Signed)
Take Flexeril as needed for muscle spasms.  You can return to work next Monday.  Return to the ED if your symptoms worsen.

## 2020-11-02 DIAGNOSIS — Z23 Encounter for immunization: Secondary | ICD-10-CM | POA: Diagnosis not present

## 2021-05-10 DIAGNOSIS — D1809 Hemangioma of other sites: Secondary | ICD-10-CM | POA: Diagnosis not present

## 2021-05-10 DIAGNOSIS — Z1283 Encounter for screening for malignant neoplasm of skin: Secondary | ICD-10-CM | POA: Diagnosis not present

## 2021-05-10 DIAGNOSIS — D225 Melanocytic nevi of trunk: Secondary | ICD-10-CM | POA: Diagnosis not present

## 2021-05-10 DIAGNOSIS — L918 Other hypertrophic disorders of the skin: Secondary | ICD-10-CM | POA: Diagnosis not present

## 2021-05-10 DIAGNOSIS — D1801 Hemangioma of skin and subcutaneous tissue: Secondary | ICD-10-CM | POA: Diagnosis not present

## 2021-05-10 DIAGNOSIS — D485 Neoplasm of uncertain behavior of skin: Secondary | ICD-10-CM | POA: Diagnosis not present

## 2021-07-27 DIAGNOSIS — Z6825 Body mass index (BMI) 25.0-25.9, adult: Secondary | ICD-10-CM | POA: Diagnosis not present

## 2021-07-27 DIAGNOSIS — I10 Essential (primary) hypertension: Secondary | ICD-10-CM | POA: Diagnosis not present

## 2021-07-27 DIAGNOSIS — Z0001 Encounter for general adult medical examination with abnormal findings: Secondary | ICD-10-CM | POA: Diagnosis not present

## 2021-07-27 DIAGNOSIS — E663 Overweight: Secondary | ICD-10-CM | POA: Diagnosis not present

## 2021-07-27 DIAGNOSIS — Z1331 Encounter for screening for depression: Secondary | ICD-10-CM | POA: Diagnosis not present

## 2021-07-28 ENCOUNTER — Other Ambulatory Visit (HOSPITAL_COMMUNITY): Payer: Self-pay | Admitting: Internal Medicine

## 2021-07-28 DIAGNOSIS — Z1231 Encounter for screening mammogram for malignant neoplasm of breast: Secondary | ICD-10-CM

## 2021-07-31 ENCOUNTER — Other Ambulatory Visit: Payer: Self-pay

## 2021-07-31 ENCOUNTER — Ambulatory Visit (HOSPITAL_COMMUNITY)
Admission: RE | Admit: 2021-07-31 | Discharge: 2021-07-31 | Disposition: A | Discharge status: Home / Self Care | Payer: BC Managed Care – PPO | Source: Ambulatory Visit | Attending: Internal Medicine | Admitting: Internal Medicine

## 2021-07-31 DIAGNOSIS — Z1231 Encounter for screening mammogram for malignant neoplasm of breast: Secondary | ICD-10-CM | POA: Insufficient documentation

## 2022-01-24 ENCOUNTER — Ambulatory Visit: Payer: Self-pay

## 2022-01-24 ENCOUNTER — Ambulatory Visit: Payer: BC Managed Care – PPO | Admitting: Orthopedic Surgery

## 2022-01-24 DIAGNOSIS — G5602 Carpal tunnel syndrome, left upper limb: Secondary | ICD-10-CM | POA: Diagnosis not present

## 2022-01-24 MED ORDER — GABAPENTIN 100 MG PO CAPS: 100.0000 mg | ORAL_CAPSULE | Freq: Three times a day (TID) | ORAL | 0 refills | Status: DC

## 2022-01-28 ENCOUNTER — Encounter: Payer: Self-pay | Admitting: Orthopedic Surgery

## 2022-01-28 NOTE — Progress Notes (Signed)
Office Visit Note   Patient: Joy Alvarado Self           Date of Birth: July 23, 1960           MRN: [id number] Visit Date: 01/24/2022 Requested by: Redmond School, MD [address] [place],  [place] [id number] PCP: Redmond School, MD  Subjective: Chief Complaint  Patient presents with   Left Hand - Pain    HPI: Joy Alvarado is a 61 y.o. female who presents to the office complaining of left hand pain.  Patient reports she has had hand pain over the last 1.5 years that has been worsening daily.  She describes a burning sensation in her left wrist that travels down the palmar aspect of her hand primarily into the middle and index fingers.  Notes numbness in this distribution as well.  Symptoms wake her up at night pretty much every night.  She had previous carpal tunnel injection at another practice that provided good relief for her for several months.  Symptoms have recurred and are bothering her enough for surgical intervention.  She has no history of prior surgery to the hand.  No neck pain or radicular pain down the arm.  No right-sided symptoms.  Does not want to do any sort of surgical intervention until November.  No history of diabetes.  She quit smoking about 1 year ago.  Enjoys walking and playing corn hole..                ROS: All systems reviewed are negative as they relate to the chief complaint within the history of present illness.  Patient denies fevers or chills.  Assessment & Plan: Visit Diagnoses:  1. Carpal tunnel syndrome, left upper limb     Plan: Patient is a 61 year old female who presents for evaluation of left hand pain.  She has pain has been bothering her over the last year and a half primarily with symptoms localized to the median nerve distribution on the palmar aspect of the hand.  She has associated numbness and tingling symptoms that wake her up most nights.  Previous carpal tunnel injection provided good relief for several months.  Has not had any nerve  study.  Plan to order nerve conduction study for further evaluation of carpal tunnel syndrome.  Carpal tunnel injection was administered under ultrasound guidance today and patient tolerated the procedure well with the exception of a slight vasovagal episode which resolved after the patient laid supine for several minutes.  .  Gabapentin prescribed to see if this will help with some symptomatic relief.  She would like to pursue surgical intervention sometime around November but plan to see her back after nerve conduction study to go over results or call her with the results and discuss further intervention which will likely be carpal tunnel release.  Follow-Up Instructions: No follow-ups on file.   Orders:  Orders Placed This Encounter  Procedures   US Guided Needle Placement - No Linked Charges   Ambulatory referral to Physical Medicine Rehab   Meds ordered this encounter  Medications   gabapentin (NEURONTIN) 100 MG capsule    Sig: Take 1 capsule (100 mg total) by mouth 3 (three) times daily.    Dispense:  90 capsule    Refill:  0      Procedures: No procedures performed   Clinical Data: No additional findings.  Objective: Vital Signs: LMP  (LMP Unknown)   Physical Exam:  Constitutional: Patient appears well-developed HEENT:  Head: Normocephalic  Eyes:EOM are normal Neck: Normal range of motion Cardiovascular: Normal rate Pulmonary/chest: Effort normal Neurologic: Patient is alert Skin: Skin is warm Psychiatric: Patient has normal mood and affect  Ortho Exam: Ortho exam demonstrates left hand with no significant atrophy of the thenar or hypothenar regions compared with contralateral hand.  She has positive Tinel sign and positive Phalen sign with reproduction of pain that shoots down from her wrist into her long finger primarily.  2+ radial pulse of the left upper extremity.  Intact EPL, FPL, finger abduction.  No subluxing ulnar nerve.  No triggering observed.  Able to make  full composite fist.  No tenderness over the first CMC, 3-4 portal, 4-5 portal, ulnar fovea, scaphoid tubercle, anatomic snuffbox.  Specialty Comments:  No specialty comments available.  Imaging: No results found.   PMFS History: Patient Active Problem List   Diagnosis Date Noted   Porokeratosis 04/18/2015   Past Medical History:  Diagnosis Date   Hypertension     No family history on file.  Past Surgical History:  Procedure Laterality Date   APPENDECTOMY     CESAREAN SECTION     COLONOSCOPY N/A 12/04/2019   Procedure: COLONOSCOPY;  Surgeon: Daneil Dolin, MD;  Location: [facility];  Service: Endoscopy;  Laterality: N/A;  12:00   FOOT SURGERY     POLYPECTOMY  12/04/2019   Procedure: POLYPECTOMY;  Surgeon: Daneil Dolin, MD;  Location: [facility];  Service: Endoscopy;;   TUBAL LIGATION     Social History   Occupational History   Not on file  Tobacco Use   Smoking status: Every Day    Packs/day: 1.00    Types: Cigarettes   Smokeless tobacco: Never  Vaping Use   Vaping Use: Never used  Substance and Sexual Activity   Alcohol use: Yes    Alcohol/week: 0.0 standard drinks of alcohol    Comment: glass wine daily   Drug use: No   Sexual activity: Not on file

## 2022-02-20 ENCOUNTER — Ambulatory Visit: Payer: BC Managed Care – PPO | Admitting: Physical Medicine and Rehabilitation

## 2022-02-20 DIAGNOSIS — R202 Paresthesia of skin: Secondary | ICD-10-CM | POA: Diagnosis not present

## 2022-02-21 NOTE — Procedures (Signed)
EMG & NCV Findings: Evaluation of the left median motor nerve showed prolonged distal onset latency (6.8 ms) and decreased conduction velocity (Elbow-Wrist, 48 m/s).  The left median (across palm) sensory nerve showed no response (Palm), prolonged distal peak latency (6.0 ms), and reduced amplitude (2.3 V).  All remaining nerves (as indicated in the following tables) were within normal limits.    All examined muscles (as indicated in the following table) showed no evidence of electrical instability.    Impression: The above electrodiagnostic study is ABNORMAL and reveals evidence of a severe left median nerve entrapment at the wrist (carpal tunnel syndrome) affecting sensory and motor components. There is no significant electrodiagnostic evidence of any other focal nerve entrapment, brachial plexopathy or cervical radiculopathy.   Recommendations: 1.  Follow-up with referring physician. 2.  Continue current management of symptoms. 3.  Suggest surgical evaluation.  ___________________________ Laurence Spates FAAPMR Board Certified, American Board of Physical Medicine and Rehabilitation    Nerve Conduction Studies Anti Sensory Summary Table   Stim Site NR Peak (ms) Norm Peak (ms) P-T Amp (V) Norm P-T Amp Site1 Site2 Delta-P (ms) Dist (cm) Vel (m/s) Norm Vel (m/s)  Left Median Acr Palm Anti Sensory (2nd Digit)  31.3C  Wrist    *6.0 <3.6 *2.3 >10 Wrist Palm  0.0    Palm *NR  <2.0              14.5  2.4         Left Radial Anti Sensory (Base 1st Digit)  31.5C  Wrist    2.0 <3.1 19.3  Wrist Base 1st Digit 2.0 0.0    Left Ulnar Anti Sensory (5th Digit)  31.7C  Wrist    3.2 <3.7 24.8 >15.0 Wrist 5th Digit 3.2 14.0 44 >38   Motor Summary Table   Stim Site NR Onset (ms) Norm Onset (ms) O-P Amp (mV) Norm O-P Amp Site1 Site2 Delta-0 (ms) Dist (cm) Vel (m/s) Norm Vel (m/s)  Left Median Motor (Abd Poll Brev)  31.7C  Wrist    *6.8 <4.2 5.4 >5 Elbow Wrist 4.3 20.5 *48 >50  Elbow    11.1  4.8          Left Ulnar Motor (Abd Dig Min)  31.7C  Wrist    2.7 <4.2 10.5 >3 B Elbow Wrist 2.8 19.0 68 >53  B Elbow    5.5  9.9  A Elbow B Elbow 1.5 10.0 67 >53  A Elbow    7.0  9.9          EMG   Side Muscle Nerve Root Ins Act Fibs Psw Amp Dur Poly Recrt Int Fraser Din Comment  Left Abd Poll Brev Median C8-T1 Nml Nml Nml Nml Nml 0 Nml Nml   Left 1stDorInt Ulnar C8-T1 Nml Nml Nml Nml Nml 0 Nml Nml   Left PronatorTeres Median C6-7 Nml Nml Nml Nml Nml 0 Nml Nml   Left Biceps Musculocut C5-6 Nml Nml Nml Nml Nml 0 Nml Nml   Left Deltoid Axillary C5-6 Nml Nml Nml Nml Nml 0 Nml Nml     Nerve Conduction Studies Anti Sensory Left/Right Comparison   Stim Site L Lat (ms) R Lat (ms) L-R Lat (ms) L Amp (V) R Amp (V) L-R Amp (%) Site1 Site2 L Vel (m/s) R Vel (m/s) L-R Vel (m/s)  Median Acr Palm Anti Sensory (2nd Digit)  31.3C  Wrist *6.0   *2.3   Wrist [employer]  14.5   2.4         Radial Anti Sensory (Base 1st Digit)  31.5C  Wrist 2.0   19.3   Wrist Base 1st Digit     Ulnar Anti Sensory (5th Digit)  31.7C  Wrist 3.2   24.8   Wrist 5th Digit 44     Motor Left/Right Comparison   Stim Site L Lat (ms) R Lat (ms) L-R Lat (ms) L Amp (mV) R Amp (mV) L-R Amp (%) Site1 Site2 L Vel (m/s) R Vel (m/s) L-R Vel (m/s)  Median Motor (Abd Poll Brev)  31.7C  Wrist *6.8   5.4   Elbow Wrist *48    Elbow 11.1   4.8         Ulnar Motor (Abd Dig Min)  31.7C  Wrist 2.7   10.5   B Elbow Wrist 68    B Elbow 5.5   9.9   A Elbow B Elbow 67    A Elbow 7.0   9.9            Waveforms:

## 2022-02-23 NOTE — Progress Notes (Signed)
Joy Alvarado Self - 61 y.o. female MRN [id number]  Date of birth: [birth year]  Office Visit Note: Visit Date: 02/20/2022 PCP: Redmond School, MD Referred by: Joy Stade, PA-C  Subjective: Chief Complaint  Patient presents with   Left Arm - Numbness   HPI:  Joy Alvarado is a 61 y.o. female who comes in today at the request of Dr. Anderson Malta for electrodiagnostic study of the Left upper extremities.  Patient is Right hand dominant.  She reports about 2 years of chronic worsening left wrist pain and arm pain with numbness tingling predominantly in the radial digits.  She does get a positive flick sign with worsening at night.  No prior electrodiagnostic studies.  No right-sided complaints.   ROS Otherwise per HPI.  Assessment & Plan: Visit Diagnoses:    ICD-10-CM   1. Paresthesia of skin  R20.2 NCV with EMG (electromyography)      Plan: Impression: The above electrodiagnostic study is ABNORMAL and reveals evidence of a severe left median nerve entrapment at the wrist (carpal tunnel syndrome) affecting sensory and motor components. There is no significant electrodiagnostic evidence of any other focal nerve entrapment, brachial plexopathy or cervical radiculopathy.   Recommendations: 1.  Follow-up with referring physician. 2.  Continue current management of symptoms. 3.  Suggest surgical evaluation.  Meds & Orders: No orders of the defined types were placed in this encounter.   Orders Placed This Encounter  Procedures   NCV with EMG (electromyography)    Follow-up: Return in about 2 weeks (around 03/06/2022) for  Anderson Malta, MD.   Procedures: No procedures performed  EMG & NCV Findings: Evaluation of the left median motor nerve showed prolonged distal onset latency (6.8 ms) and decreased conduction velocity (Elbow-Wrist, 48 m/s).  The left median (across palm) sensory nerve showed no response (Palm), prolonged distal peak latency (6.0 ms), and reduced amplitude (2.3  V).  All remaining nerves (as indicated in the following tables) were within normal limits.    All examined muscles (as indicated in the following table) showed no evidence of electrical instability.    Impression: The above electrodiagnostic study is ABNORMAL and reveals evidence of a severe left median nerve entrapment at the wrist (carpal tunnel syndrome) affecting sensory and motor components. There is no significant electrodiagnostic evidence of any other focal nerve entrapment, brachial plexopathy or cervical radiculopathy.   Recommendations: 1.  Follow-up with referring physician. 2.  Continue current management of symptoms. 3.  Suggest surgical evaluation.  ___________________________ Laurence Spates FAAPMR Board Certified, American Board of Physical Medicine and Rehabilitation    Nerve Conduction Studies Anti Sensory Summary Table   Stim Site NR Peak (ms) Norm Peak (ms) P-T Amp (V) Norm P-T Amp Site1 Site2 Delta-P (ms) Dist (cm) Vel (m/s) Norm Vel (m/s)  Left Median Acr Palm Anti Sensory (2nd Digit)  31.3C  Wrist    *6.0 <3.6 *2.3 >10 Wrist Palm  0.0    Palm *NR  <2.0              14.5  2.4         Left Radial Anti Sensory (Base 1st Digit)  31.5C  Wrist    2.0 <3.1 19.3  Wrist Base 1st Digit 2.0 0.0    Left Ulnar Anti Sensory (5th Digit)  31.7C  Wrist    3.2 <3.7 24.8 >15.0 Wrist 5th Digit 3.2 14.0 44 >38   Motor Summary Table   Stim Site NR Onset (ms)  Norm Onset (ms) O-P Amp (mV) Norm O-P Amp Site1 Site2 Delta-0 (ms) Dist (cm) Vel (m/s) Norm Vel (m/s)  Left Median Motor (Abd Poll Brev)  31.7C  Wrist    *6.8 <4.2 5.4 >5 Elbow Wrist 4.3 20.5 *48 >50  Elbow    11.1  4.8         Left Ulnar Motor (Abd Dig Min)  31.7C  Wrist    2.7 <4.2 10.5 >3 B Elbow Wrist 2.8 19.0 68 >53  B Elbow    5.5  9.9  A Elbow B Elbow 1.5 10.0 67 >53  A Elbow    7.0  9.9          EMG   Side Muscle Nerve Root Ins Act Fibs Psw Amp Dur Poly Recrt Int Fraser Din Comment  Left Abd Poll Brev Median  C8-T1 Nml Nml Nml Nml Nml 0 Nml Nml   Left 1stDorInt Ulnar C8-T1 Nml Nml Nml Nml Nml 0 Nml Nml   Left PronatorTeres Median C6-7 Nml Nml Nml Nml Nml 0 Nml Nml   Left Biceps Musculocut C5-6 Nml Nml Nml Nml Nml 0 Nml Nml   Left Deltoid Axillary C5-6 Nml Nml Nml Nml Nml 0 Nml Nml     Nerve Conduction Studies Anti Sensory Left/Right Comparison   Stim Site L Lat (ms) R Lat (ms) L-R Lat (ms) L Amp (V) R Amp (V) L-R Amp (%) Site1 Site2 L Vel (m/s) R Vel (m/s) L-R Vel (m/s)  Median Acr Palm Anti Sensory (2nd Digit)  31.3C  Wrist *6.0   *2.3   Wrist Palm     Palm              14.5   2.4         Radial Anti Sensory (Base 1st Digit)  31.5C  Wrist 2.0   19.3   Wrist Base 1st Digit     Ulnar Anti Sensory (5th Digit)  31.7C  Wrist 3.2   24.8   Wrist 5th Digit 44     Motor Left/Right Comparison   Stim Site L Lat (ms) R Lat (ms) L-R Lat (ms) L Amp (mV) R Amp (mV) L-R Amp (%) Site1 Site2 L Vel (m/s) R Vel (m/s) L-R Vel (m/s)  Median Motor (Abd Poll Brev)  31.7C  Wrist *6.8   5.4   Elbow Wrist *48    Elbow 11.1   4.8         Ulnar Motor (Abd Dig Min)  31.7C  Wrist 2.7   10.5   B Elbow Wrist 68    B Elbow 5.5   9.9   A Elbow B Elbow 67    A Elbow 7.0   9.9            Waveforms:             Clinical History: No specialty comments available.     Objective:  VS:  HT:    WT:   BMI:     BP:   HR: bpm  TEMP: ( )  RESP:  Physical Exam Musculoskeletal:        General: No swelling, tenderness or deformity.     Comments: Inspection reveals no atrophy of the bilateral APB or FDI or hand intrinsics. There is no swelling, color changes, allodynia or dystrophic changes. There is 5 out of 5 strength in the bilateral wrist extension, finger abduction and long finger flexion. There is intact sensation to light touch in all dermatomal  and peripheral nerve distributions.  There is a negative Hoffmann's test bilaterally.  Skin:    General: Skin is warm and dry.     Findings: No erythema or  rash.  Neurological:     General: No focal deficit present.     Mental Status: She is alert and oriented to person, place, and time.     Motor: No weakness or abnormal muscle tone.     Coordination: Coordination normal.  Psychiatric:        Mood and Affect: Mood normal.        Behavior: Behavior normal.      Imaging: No results found.

## 2022-03-05 ENCOUNTER — Ambulatory Visit: Payer: BC Managed Care – PPO | Admitting: Surgical

## 2022-03-05 ENCOUNTER — Encounter: Payer: Self-pay | Admitting: Surgical

## 2022-03-05 ENCOUNTER — Other Ambulatory Visit: Payer: Self-pay | Admitting: Surgical

## 2022-03-05 DIAGNOSIS — G5602 Carpal tunnel syndrome, left upper limb: Secondary | ICD-10-CM

## 2022-03-05 DIAGNOSIS — R202 Paresthesia of skin: Secondary | ICD-10-CM

## 2022-03-05 MED ORDER — GABAPENTIN 300 MG PO CAPS: 300.0000 mg | ORAL_CAPSULE | Freq: Three times a day (TID) | ORAL | 2 refills | Status: DC

## 2022-03-05 NOTE — Progress Notes (Signed)
Follow-up Office Visit Note   Patient: Joy Alvarado           Date of Birth: March 01, 1961           MRN: [id number] Visit Date: 03/05/2022 Requested by: Redmond School, MD [address] [place],  [place] [address] PCP: Redmond School, MD  Subjective: Chief Complaint  Patient presents with   Other     Review EMG/NCV study    HPI: Joy Alvarado is a 61 y.o. female who returns to the office for follow-up visit.    Plan at last visit was: Patient is a 61 year old female who presents for evaluation of left hand pain.  She has pain has been bothering her over the last year and a half primarily with symptoms localized to the median nerve distribution on the palmar aspect of the hand.  She has associated numbness and tingling symptoms that wake her up most nights.  Previous carpal tunnel injection provided good relief for several months.  Has not had any nerve study.  Plan to order nerve conduction study for further evaluation of carpal tunnel syndrome.  Carpal tunnel injection was administered under ultrasound guidance today and patient tolerated the procedure well with the exception of a slight vasovagal episode which resolved after the patient laid supine for several minutes.  .  Gabapentin prescribed to see if this will help with some symptomatic relief.  She would like to pursue surgical intervention sometime around November but plan to see her back after nerve conduction study to go over results or call her with the results and discuss further intervention which will likely be carpal tunnel release.  Since then, patient notes she has had nerve conduction study done by Dr. 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  She had injection at her last appointment that gave her 3 to 4 weeks of relief but now symptoms are returning.  She has had some relief from the gabapentin and would like a refill of this.              ROS: All systems reviewed are negative as  they relate to the chief complaint within the history of present illness.  Patient denies fevers or chills.  Assessment & Plan: Visit Diagnoses:  1. Carpal tunnel syndrome, left upper limb   2. Paresthesia of skin     Plan: Joy Alvarado is a 61 y.o. female who returns to the office for follow-up visit for left carpal tunnel syndrome.  Plan from last visit was noted above in HPI.  They now return with return of symptoms after 3 to 4 weeks of relief from carpal tunnel injection.  She would like to proceed with surgery in early January 2024.  Discussed the surgical process and what surgery entails in great depth with Peaches including the recovery timeframe and risks and benefits.  Risks and benefits discussed included but not limited to risk of wound dehiscence, infection, nerve/blood vessel damage, incomplete resolution of symptoms, persistent numbness or tingling, need for revision surgery in the future.  She understands that there will be a approximately 64-monthperiod of pillar pain and decreased grip strength.  She would like to have this done on Thursday afternoon.  Gabapentin refilled.  She spoke with DJackelyn Polingtoday and she will follow-up with the office after procedure.  Follow-Up Instructions: Return for After procedure.   Orders:  No orders of the defined types were placed in this encounter.  No orders of  the defined types were placed in this encounter.     Procedures: No procedures performed   Clinical Data: No additional findings.  Objective: Vital Signs: LMP  (LMP Unknown)   Physical Exam:  Constitutional: Patient appears well-developed HEENT:  Head: Normocephalic Eyes:EOM are normal Neck: Normal range of motion Cardiovascular: Normal rate Pulmonary/chest: Effort normal Neurologic: Patient is alert Skin: Skin is warm Psychiatric: Patient has normal mood and affect  Ortho Exam: Ortho exam demonstrates very slight atrophy of the thenar musculature.  She does have some  weakness of opposition but intact EPL, FPL, grip strength, wrist extension, finger abduction, pronation/supination.  2+ radial pulse of the left upper extremity.  Specialty Comments:  No specialty comments available.  Imaging: No results found.   PMFS History: Patient Active Problem List   Diagnosis Date Noted   Porokeratosis 04/18/2015   Past Medical History:  Diagnosis Date   Hypertension     History reviewed. No pertinent family history.  Past Surgical History:  Procedure Laterality Date   APPENDECTOMY     CESAREAN SECTION     COLONOSCOPY N/A 12/04/2019   Procedure: COLONOSCOPY;  Surgeon: Daneil Dolin, MD;  [facility];  Service: Endoscopy;  Laterality: N/A;  12:00   FOOT SURGERY     POLYPECTOMY  12/04/2019   Procedure: POLYPECTOMY;  Surgeon: Daneil Dolin, MD;  [facility];  Service: Endoscopy;;   TUBAL LIGATION     Social History   Occupational History   Not on file  Tobacco Use   Smoking status: Every Day    Packs/day: 1.00    Types: Cigarettes   Smokeless tobacco: Never  Vaping Use   Vaping Use: Never used  Substance and Sexual Activity   Alcohol use: Yes    Alcohol/week: 0.0 standard drinks of alcohol    Comment: glass wine daily   Drug use: No   Sexual activity: Not on file

## 2022-03-30 ENCOUNTER — Other Ambulatory Visit: Payer: Self-pay

## 2022-03-30 ENCOUNTER — Encounter (HOSPITAL_BASED_OUTPATIENT_CLINIC_OR_DEPARTMENT_OTHER): Payer: Self-pay | Admitting: Orthopedic Surgery

## 2022-04-06 ENCOUNTER — Other Ambulatory Visit: Payer: Self-pay

## 2022-04-06 ENCOUNTER — Encounter (HOSPITAL_BASED_OUTPATIENT_CLINIC_OR_DEPARTMENT_OTHER)
Admission: RE | Admit: 2022-04-06 | Discharge: 2022-04-06 | Disposition: A | Discharge status: Home / Self Care | Payer: BC Managed Care – PPO | Source: Ambulatory Visit | Attending: Orthopedic Surgery | Admitting: Orthopedic Surgery

## 2022-04-06 DIAGNOSIS — I10 Essential (primary) hypertension: Secondary | ICD-10-CM | POA: Diagnosis not present

## 2022-04-06 DIAGNOSIS — Z79899 Other long term (current) drug therapy: Secondary | ICD-10-CM | POA: Diagnosis not present

## 2022-04-06 DIAGNOSIS — Z87891 Personal history of nicotine dependence: Secondary | ICD-10-CM | POA: Diagnosis not present

## 2022-04-06 DIAGNOSIS — G5602 Carpal tunnel syndrome, left upper limb: Secondary | ICD-10-CM | POA: Diagnosis not present

## 2022-04-06 NOTE — Progress Notes (Signed)

## 2022-04-08 NOTE — Anesthesia Preprocedure Evaluation (Signed)
Anesthesia Evaluation  Patient identified by MRN, date of birth, ID band Patient awake    Reviewed: Allergy & Precautions, NPO status , Patient's Chart, lab work & pertinent test results  Airway Mallampati: III  TM Distance: >3 FB Neck ROM: Full    Dental  (+) Chipped,    Pulmonary former smoker,    Pulmonary exam normal        Cardiovascular hypertension, Pt. on medications Normal cardiovascular exam     Neuro/Psych  Neuromuscular disease negative psych ROS   GI/Hepatic negative GI ROS, Neg liver ROS,   Endo/Other  negative endocrine ROS  Renal/GU negative Renal ROS     Musculoskeletal negative musculoskeletal ROS (+)   Abdominal   Peds  Hematology negative hematology ROS (+)   Anesthesia Other Findings left carpal tunnel syndrome  Reproductive/Obstetrics                            Anesthesia Physical Anesthesia Plan  ASA: 2  Anesthesia Plan: Bier Block and Bier Block-LIDOCAINE ONLY   Post-op Pain Management:    Induction: Intravenous  PONV Risk Score and Plan: Ondansetron, Dexamethasone, Propofol infusion, Midazolam and Treatment may vary due to age or medical condition  Airway Management Planned: Simple Face Mask  Additional Equipment:   Intra-op Plan:   Post-operative Plan:   Informed Consent: I have reviewed the patients History and Physical, chart, labs and discussed the procedure including the risks, benefits and alternatives for the proposed anesthesia with the patient or authorized representative who has indicated his/her understanding and acceptance.     Dental advisory given  Plan Discussed with: CRNA  Anesthesia Plan Comments:        Anesthesia Quick Evaluation

## 2022-04-09 ENCOUNTER — Ambulatory Visit (HOSPITAL_BASED_OUTPATIENT_CLINIC_OR_DEPARTMENT_OTHER): Payer: BC Managed Care – PPO | Admitting: Anesthesiology

## 2022-04-09 ENCOUNTER — Other Ambulatory Visit: Payer: Self-pay

## 2022-04-09 ENCOUNTER — Encounter (HOSPITAL_BASED_OUTPATIENT_CLINIC_OR_DEPARTMENT_OTHER): Payer: Self-pay | Admitting: Orthopedic Surgery

## 2022-04-09 ENCOUNTER — Ambulatory Visit (HOSPITAL_BASED_OUTPATIENT_CLINIC_OR_DEPARTMENT_OTHER)
Admission: RE | Admit: 2022-04-09 | Discharge: 2022-04-09 | Disposition: A | Discharge status: Home / Self Care | Payer: BC Managed Care – PPO | Attending: Orthopedic Surgery | Admitting: Orthopedic Surgery

## 2022-04-09 ENCOUNTER — Encounter (HOSPITAL_BASED_OUTPATIENT_CLINIC_OR_DEPARTMENT_OTHER)
Admission: RE | Disposition: A | Discharge status: Home / Self Care | Payer: Self-pay | Source: Home / Self Care | Attending: Orthopedic Surgery

## 2022-04-09 DIAGNOSIS — I10 Essential (primary) hypertension: Secondary | ICD-10-CM | POA: Insufficient documentation

## 2022-04-09 DIAGNOSIS — Z79899 Other long term (current) drug therapy: Secondary | ICD-10-CM | POA: Insufficient documentation

## 2022-04-09 DIAGNOSIS — G709 Myoneural disorder, unspecified: Secondary | ICD-10-CM | POA: Diagnosis not present

## 2022-04-09 DIAGNOSIS — G5602 Carpal tunnel syndrome, left upper limb: Secondary | ICD-10-CM | POA: Diagnosis not present

## 2022-04-09 DIAGNOSIS — Z87891 Personal history of nicotine dependence: Secondary | ICD-10-CM | POA: Insufficient documentation

## 2022-04-09 DIAGNOSIS — Z01818 Encounter for other preprocedural examination: Secondary | ICD-10-CM

## 2022-04-09 HISTORY — PX: CARPAL TUNNEL RELEASE: SHX101

## 2022-04-09 HISTORY — DX: Other specified postprocedural states: R11.2

## 2022-04-09 HISTORY — DX: Personal history of nicotine dependence: Z87.891

## 2022-04-09 HISTORY — DX: Carpal tunnel syndrome, left upper limb: G56.02

## 2022-04-09 LAB — CBC
HCT: 38.6 % (ref 36.0–46.0)
Hemoglobin: 13.3 g/dL (ref 12.0–15.0)
MCH: 31.2 pg (ref 26.0–34.0)
MCHC: 34.5 g/dL (ref 30.0–36.0)
MCV: 90.6 fL (ref 80.0–100.0)
Platelets: 248 10*3/uL (ref 150–400)
RBC: 4.26 MIL/uL (ref 3.87–5.11)
RDW: 12.2 % (ref 11.5–15.5)
WBC: 5.9 10*3/uL (ref 4.0–10.5)
nRBC: 0 % (ref 0.0–0.2)

## 2022-04-09 SURGERY — CARPAL TUNNEL RELEASE
Anesthesia: Regional | Site: Wrist | Laterality: Left

## 2022-04-09 MED ORDER — KETOROLAC TROMETHAMINE 30 MG/ML IJ SOLN: 30.0000 mg | Freq: Once | INTRAMUSCULAR | Status: DC | PRN

## 2022-04-09 MED ORDER — FENTANYL CITRATE (PF) 100 MCG/2ML IJ SOLN: 25.0000 ug | INTRAMUSCULAR | Status: DC | PRN

## 2022-04-09 MED ORDER — ACETAMINOPHEN 500 MG PO TABS
ORAL_TABLET | ORAL | Status: AC
  Filled 2022-04-09: qty 2

## 2022-04-09 MED ORDER — POVIDONE-IODINE 7.5 % EX SOLN
Freq: Once | CUTANEOUS | Status: DC
  Filled 2022-04-09: qty 118

## 2022-04-09 MED ORDER — BUPIVACAINE HCL (PF) 0.25 % IJ SOLN
INTRAMUSCULAR | Status: AC
  Filled 2022-04-09: qty 30

## 2022-04-09 MED ORDER — LACTATED RINGERS IV SOLN: INTRAVENOUS | Status: DC

## 2022-04-09 MED ORDER — BUPIVACAINE HCL (PF) 0.25 % IJ SOLN
INTRAMUSCULAR | Status: DC | PRN
  Administered 2022-04-09: 16 mL

## 2022-04-09 MED ORDER — LIDOCAINE 2% (20 MG/ML) 5 ML SYRINGE
INTRAMUSCULAR | Status: AC
  Filled 2022-04-09: qty 20

## 2022-04-09 MED ORDER — LIDOCAINE HCL (CARDIAC) PF 100 MG/5ML IV SOSY
PREFILLED_SYRINGE | INTRAVENOUS | Status: DC | PRN
  Administered 2022-04-09: 20 mg via INTRAVENOUS

## 2022-04-09 MED ORDER — PROMETHAZINE HCL 25 MG/ML IJ SOLN: 6.2500 mg | INTRAMUSCULAR | Status: DC | PRN

## 2022-04-09 MED ORDER — PROPOFOL 10 MG/ML IV BOLUS
INTRAVENOUS | Status: AC
  Filled 2022-04-09: qty 20

## 2022-04-09 MED ORDER — COCAINE HCL 40 MG/ML NA SOLN
NASAL | Status: AC
  Filled 2022-04-09: qty 4

## 2022-04-09 MED ORDER — ONDANSETRON HCL 4 MG/2ML IJ SOLN
INTRAMUSCULAR | Status: AC
  Filled 2022-04-09: qty 8

## 2022-04-09 MED ORDER — 0.9 % SODIUM CHLORIDE (POUR BTL) OPTIME
TOPICAL | Status: DC | PRN
  Administered 2022-04-09: 200 mL

## 2022-04-09 MED ORDER — POVIDONE-IODINE 10 % EX SWAB: 2.0000 | Freq: Once | CUTANEOUS | Status: DC

## 2022-04-09 MED ORDER — OXYCODONE HCL 5 MG/5ML PO SOLN: 5.0000 mg | Freq: Once | ORAL | Status: DC | PRN

## 2022-04-09 MED ORDER — PROPOFOL 500 MG/50ML IV EMUL
INTRAVENOUS | Status: DC | PRN
  Administered 2022-04-09: 75 ug/kg/min via INTRAVENOUS

## 2022-04-09 MED ORDER — CEFAZOLIN SODIUM-DEXTROSE 2-4 GM/100ML-% IV SOLN
INTRAVENOUS | Status: AC
  Filled 2022-04-09: qty 100

## 2022-04-09 MED ORDER — LIDOCAINE HCL (PF) 0.5 % IJ SOLN
INTRAMUSCULAR | Status: DC | PRN
  Administered 2022-04-09: 30 mL via INTRAVENOUS

## 2022-04-09 MED ORDER — FENTANYL CITRATE (PF) 100 MCG/2ML IJ SOLN
INTRAMUSCULAR | Status: DC | PRN
  Administered 2022-04-09: 50 ug via INTRAVENOUS

## 2022-04-09 MED ORDER — FENTANYL CITRATE (PF) 100 MCG/2ML IJ SOLN
INTRAMUSCULAR | Status: AC
  Filled 2022-04-09: qty 2

## 2022-04-09 MED ORDER — TRAMADOL HCL 50 MG PO TABS: 50.0000 mg | ORAL_TABLET | Freq: Four times a day (QID) | ORAL | 0 refills | Status: AC | PRN

## 2022-04-09 MED ORDER — AMISULPRIDE (ANTIEMETIC) 5 MG/2ML IV SOLN: 10.0000 mg | Freq: Once | INTRAVENOUS | Status: DC | PRN

## 2022-04-09 MED ORDER — ONDANSETRON HCL 4 MG/2ML IJ SOLN
INTRAMUSCULAR | Status: DC | PRN
  Administered 2022-04-09: 4 mg via INTRAVENOUS

## 2022-04-09 MED ORDER — OXYCODONE HCL 5 MG PO TABS: 5.0000 mg | ORAL_TABLET | Freq: Once | ORAL | Status: DC | PRN

## 2022-04-09 MED ORDER — ACETAMINOPHEN 500 MG PO TABS
1000.0000 mg | ORAL_TABLET | Freq: Once | ORAL | Status: AC
  Administered 2022-04-09: 1000 mg via ORAL

## 2022-04-09 MED ORDER — MIDAZOLAM HCL 5 MG/5ML IJ SOLN
INTRAMUSCULAR | Status: DC | PRN
  Administered 2022-04-09: 2 mg via INTRAVENOUS

## 2022-04-09 MED ORDER — MIDAZOLAM HCL 2 MG/2ML IJ SOLN
INTRAMUSCULAR | Status: AC
  Filled 2022-04-09: qty 2

## 2022-04-09 MED ORDER — CEFAZOLIN SODIUM-DEXTROSE 2-4 GM/100ML-% IV SOLN
2.0000 g | INTRAVENOUS | Status: AC
  Administered 2022-04-09: 2 g via INTRAVENOUS

## 2022-04-09 SURGICAL SUPPLY — 35 items
BLADE SURG 15 STRL LF DISP TIS (BLADE) ×2 IMPLANT
BLADE SURG 15 STRL SS (BLADE) ×2
BNDG CMPR 9X4 STRL LF SNTH (GAUZE/BANDAGES/DRESSINGS) ×1
BNDG ELASTIC 3X5.8 VLCR STR LF (GAUZE/BANDAGES/DRESSINGS) ×2 IMPLANT
BNDG ELASTIC 4X5.8 VLCR STR LF (GAUZE/BANDAGES/DRESSINGS) ×2 IMPLANT
BNDG ESMARK 4X9 LF (GAUZE/BANDAGES/DRESSINGS) IMPLANT
CORD BIPOLAR FORCEPS 12FT (ELECTRODE) ×2 IMPLANT
COVER BACK TABLE 60X90IN (DRAPES) ×2 IMPLANT
COVER MAYO STAND STRL (DRAPES) ×2 IMPLANT
DRAPE EXTREMITY T 121X128X90 (DISPOSABLE) ×2 IMPLANT
DRAPE IMP U-DRAPE 54X76 (DRAPES) ×2 IMPLANT
DRAPE INCISE IOBAN 66X45 STRL (DRAPES) IMPLANT
DRAPE SURG 17X23 STRL (DRAPES) IMPLANT
DURAPREP 26ML APPLICATOR (WOUND CARE) ×2 IMPLANT
GAUZE PAD ABD 8X10 STRL (GAUZE/BANDAGES/DRESSINGS) ×2 IMPLANT
GAUZE XEROFORM 1X8 LF (GAUZE/BANDAGES/DRESSINGS) IMPLANT
GOWN STRL REUS W/ TWL LRG LVL3 (GOWN DISPOSABLE) ×4 IMPLANT
GOWN STRL REUS W/ TWL XL LVL3 (GOWN DISPOSABLE) ×2 IMPLANT
GOWN STRL REUS W/TWL LRG LVL3 (GOWN DISPOSABLE) ×1
GOWN STRL REUS W/TWL XL LVL3 (GOWN DISPOSABLE) ×1 IMPLANT
NDL HYPO 25X1 1.5 SAFETY (NEEDLE) ×2 IMPLANT
NEEDLE HYPO 25X1 1.5 SAFETY (NEEDLE) ×1 IMPLANT
NS IRRIG 1000ML POUR BTL (IV SOLUTION) IMPLANT
PACK BASIN DAY SURGERY FS (CUSTOM PROCEDURE TRAY) ×2 IMPLANT
PAD CAST 3X4 CTTN HI CHSV (CAST SUPPLIES) IMPLANT
PAD CAST 4YDX4 CTTN HI CHSV (CAST SUPPLIES) ×2 IMPLANT
PADDING CAST COTTON 3X4 STRL (CAST SUPPLIES) ×1
PADDING CAST COTTON 4X4 STRL (CAST SUPPLIES) ×1
SPLINT PLASTER CAST XFAST 4X15 (CAST SUPPLIES) IMPLANT
STOCKINETTE 4X48 STRL (DRAPES) ×2 IMPLANT
SUT ETHILON 3 0 PS 1 (SUTURE) IMPLANT
SYR BULB EAR ULCER 3OZ GRN STR (SYRINGE) IMPLANT
SYR CONTROL 10ML LL (SYRINGE) ×2 IMPLANT
TOWEL GREEN STERILE FF (TOWEL DISPOSABLE) ×2 IMPLANT
UNDERPAD 30X36 HEAVY ABSORB (UNDERPADS AND DIAPERS) ×2 IMPLANT

## 2022-04-09 NOTE — Anesthesia Postprocedure Evaluation (Signed)
Anesthesia Post Note  Patient: Joy Alvarado Self  Procedure(s) Performed: LEFT CARPAL TUNNEL RELEASE (Left: Wrist)     Patient location during evaluation: PACU Anesthesia Type: Bier Block Level of consciousness: awake and alert Pain management: pain level controlled Vital Signs Assessment: post-procedure vital signs reviewed and stable Respiratory status: spontaneous breathing, nonlabored ventilation, respiratory function stable and patient connected to nasal cannula oxygen Cardiovascular status: stable and blood pressure returned to baseline Postop Assessment: no apparent nausea or vomiting Anesthetic complications: no   No notable events documented.  Last Vitals:  Vitals:   04/09/22 0845 04/09/22 0906  BP: 120/66 128/72  Pulse: 64 (!) 56  Resp: 15 17  Temp:  (!) 36.2 C  SpO2: 95% 95%    Last Pain:  Vitals:   04/09/22 0906  TempSrc: Oral  PainSc: 0-No pain                 [name]

## 2022-04-09 NOTE — Anesthesia Procedure Notes (Signed)
Procedure Name: MAC Date/Time: 04/09/2022 7:50 AM  Performed by: Signe Colt, CRNAPre-anesthesia Checklist: Patient identified, Emergency Drugs available, Suction available, Timeout performed and Patient being monitored Patient Re-evaluated:Patient Re-evaluated prior to induction Oxygen Delivery Method: Simple face mask

## 2022-04-09 NOTE — Brief Op Note (Signed)
   04/09/2022  8:21 AM  PATIENT:  Joy Alvarado  61 y.o. female  PRE-OPERATIVE DIAGNOSIS:  left carpal tunnel syndrome  POST-OPERATIVE DIAGNOSIS:  left carpal tunnel syndrome  PROCEDURE:  Procedure(s): LEFT CARPAL TUNNEL RELEASE  SURGEON:  Surgeon(s): Meredith Pel, MD  ASSISTANT: magnant pa  ANESTHESIA:   regional  EBL: 2 ml    Total I/O In: 500 [I.V.:500] Out: -   BLOOD ADMINISTERED: none  DRAINS: none   LOCAL MEDICATIONS USED:  marcaine plain SPECIMEN:  No Specimen  COUNTS:  YES  TOURNIQUET:   Total Tourniquet Time Documented: Forearm (Left) - 18 minutes Total: Forearm (Left) - 18 minutes   DICTATION: .Other Dictation: Dictation Number [id number]  PLAN OF CARE: Discharge to home after PACU  PATIENT DISPOSITION:  PACU - hemodynamically stable

## 2022-04-09 NOTE — H&P (Addendum)
Joy Alvarado is an 61 y.o. female.   Chief Complaint: left wrist pain HPI: Patient is a 61 year old female with left hand pain.  She has pain has been bothering her over the last year and a half primarily with symptoms localized to the median nerve distribution on the palmar aspect of the hand.  She has associated numbness and tingling symptoms that wake her up most nights.  Previous carpal tunnel injection provided good relief for several months.  Has not had any nerve study.  Plan to order nerve conduction study for further evaluation of carpal tunnel syndrome.    .  Gabapentin prescribed to see if this will help with some symptomatic relief.  She would like to pursue surgical intervention sometime around November but plan to see her back after nerve conduction study to go over results or call her with the results and discuss further intervention which will likely be carpal tunnel release.   Since then, patient notes she has had nerve conduction study done by Dr. 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  She had injection at her last appointment that gave her 3 to 4 weeks of relief but now symptoms are returning.  She has had some relief from the gabapentin and would like a refill of this.  Past Medical History:  Diagnosis Date   Former smoker    Hypertension    Left carpal tunnel syndrome    left   PONV (postoperative nausea and vomiting)     Past Surgical History:  Procedure Laterality Date   APPENDECTOMY     CESAREAN SECTION     COLONOSCOPY N/A 12/04/2019   Procedure: COLONOSCOPY;  Surgeon: Daneil Dolin, MD;  [facility];  Service: Endoscopy;  Laterality: N/A;  12:00   FOOT SURGERY     POLYPECTOMY  12/04/2019   Procedure: POLYPECTOMY;  Surgeon: Daneil Dolin, MD;  [facility];  Service: Endoscopy;;   TUBAL LIGATION      History reviewed. No pertinent family history. Social History:  reports that she quit smoking about 12  months ago. Her smoking use included cigarettes. She smoked an average of 1 pack per day. She has never used smokeless tobacco. She reports current alcohol use. She reports that she does not use drugs.  Allergies: No Known Allergies  Medications Prior to Admission  Medication Sig Dispense Refill   amLODipine (NORVASC) 10 MG tablet Take 1 tablet (10 mg total) by mouth daily. Please schedule appt for future refills thank you. 30 tablet 0   cholecalciferol (VITAMIN D3) 25 MCG (1000 UNIT) tablet Take 1,000 Units by mouth daily.     gabapentin (NEURONTIN) 100 MG capsule Take 100 mg by mouth 3 (three) times daily.     lisinopril (ZESTRIL) 40 MG tablet Take 40 mg by mouth daily.     Multiple Vitamin (MULTIVITAMIN) capsule Take 1 capsule by mouth daily.     Potassium Gluconate 550 MG TABS Take 550 mg by mouth daily.      Results for orders placed or performed during the hospital encounter of 04/09/22 (from the past 48 hour(s))  CBC     Status: None   Collection Time: 04/09/22  6:32 AM  Result Value Ref Range   WBC 5.9 4.0 - 10.5 K/uL   RBC 4.26 3.87 - 5.11 MIL/uL   Hemoglobin 13.3 12.0 - 15.0 g/dL   HCT 38.6 36.0 - 46.0 %   MCV 90.6 80.0 - 100.0  fL   MCH 31.2 26.0 - 34.0 pg   MCHC 34.5 30.0 - 36.0 g/dL   RDW 12.2 11.5 - 15.5 %   Platelets 248 150 - 400 K/uL   nRBC 0.0 0.0 - 0.2 %    Comment: Performed at [facility], [place] [address] [place], [place] [address]   No results found.  Review of Systems  Musculoskeletal:  Positive for arthralgias.  All other systems reviewed and are negative.   Blood pressure 139/64, pulse 67, temperature 98.6 F (37 C), temperature source Oral, resp. rate 16, height '5\' 7"'$  (1.702 m), weight 75.7 kg, SpO2 98 %. Physical Exam Vitals reviewed.  HENT:     Head: Normocephalic.     Nose: Nose normal.     Mouth/Throat:     Mouth: Mucous membranes are moist.  Eyes:     Pupils: Pupils are equal, round, and reactive to light.  Cardiovascular:     Rate  and Rhythm: Normal rate.     Pulses: Normal pulses.  Pulmonary:     Effort: Pulmonary effort is normal.  Abdominal:     General: Abdomen is flat.  Musculoskeletal:     Cervical back: Normal range of motion.  Skin:    General: Skin is warm.     Capillary Refill: Capillary refill takes less than 2 seconds.  Neurological:     General: No focal deficit present.     Mental Status: She is alert.  Psychiatric:        Mood and Affect: Mood normal.    Ortho exam demonstrates very slight atrophy of the thenar musculature.  She does have some weakness of opposition but intact EPL, FPL, grip strength, wrist extension, finger abduction, pronation/supination.  2+ radial pulse of the left upper extremity  Assessment/Plan Impression is severe CTS by ncv and clinically. Plan CTR. Discussion of the  process and what surgery entails done in great depth with Joy Alvarado including the recovery timeframe and risks and benefits.  Risks and benefits discussed included but not limited to risk of wound dehiscence, infection, nerve/blood vessel damage, incomplete resolution of symptoms, persistent numbness or tingling, need for revision surgery in the future.  She understands that there will be a approximately 7-monthperiod of pillar pain and decreased grip strength.  All questions answered. Joy Malta MD 04/09/2022, 7:04 AM

## 2022-04-09 NOTE — Transfer of Care (Signed)
Immediate Anesthesia Transfer of Care Note  Patient: Joy Alvarado  Procedure(s) Performed: LEFT CARPAL TUNNEL RELEASE (Left: Wrist)  Patient Location: PACU  Anesthesia Type:Bier block  Level of Consciousness: awake, alert , oriented and patient cooperative  Airway & Oxygen Therapy: Patient Spontanous Breathing and Patient connected to face mask oxygen  Post-op Assessment: Report given to RN and Post -op Vital signs reviewed and stable  Post vital signs: Reviewed and stable  Last Vitals:  Vitals Value Taken Time  BP    Temp    Pulse 58 04/09/22 0820  Resp    SpO2 98 % 04/09/22 0820  Vitals shown include unvalidated device data.  Last Pain:  Vitals:   04/09/22 0627  TempSrc: Oral  PainSc: 6       Patients Stated Pain Goal: 6 (60/02/98 4730)  Complications: No notable events documented.

## 2022-04-09 NOTE — Anesthesia Procedure Notes (Signed)
Anesthesia Regional Block: Bier block (IV Regional)   Pre-Anesthetic Checklist: , timeout performed,  Correct Patient, Correct Site, Correct Laterality,  Correct Procedure,, site marked,  Surgical consent,  At surgeon's request  Laterality: Left         Needles:  Injection technique: Single-shot  Needle Type: Other      Needle Gauge: 20     Additional Needles:   Procedures:,,,,, intact distal pulses, Esmarch exsanguination,  Single tourniquet utilized    Narrative:   Performed by: [employer]

## 2022-04-09 NOTE — Op Note (Unsigned)
NAME: SELF, [place] RECORD NO: [id number] ACCOUNT NO: [id number] DATE OF BIRTH: [birth year] FACILITY: [facility] LOCATION: [facility] PHYSICIAN: Yetta Barre. Marlou Sa, MD  Operative Report   DATE OF PROCEDURE: 04/09/2022  PREOPERATIVE DIAGNOSIS:  Left carpal tunnel syndrome.  POSTOPERATIVE DIAGNOSIS:  Left carpal tunnel syndrome.  PROCEDURE:  Left carpal tunnel release.  SURGEON:  Yetta Barre. Marlou Sa, MD  ASSISTANT:  Annie Main, PA.  INDICATIONS:  [place] is a 61 year old patient with clinical and EMG nerve study documented severe left carpal tunnel syndrome refractory to nonoperative management, who presents for operative management after explanation of risks and benefits.  DESCRIPTION OF PROCEDURE:  The patient was brought to the operating room where Bier block anesthetic was induced.  Preoperative antibiotics administered.  Timeout was called.  Left hand prescrubbed with alcohol and Betadine, allowed to air dry.  Prepped  with DuraPrep solution and draped in sterile manner.  After calling time-out, an incision made at the junction of Kaplan's cardinal line and the radial border of the fourth finger extending down to the distal wrist flexion crease.  Skin and subcutaneous  tissues were sharply divided.  Palmar fascia was identified and divided.  Bipolar electrocautery was utilized to control bleeding points.  Transverse carpal ligament was identified and incised in its midportion 2-3 mm longitudinally.  Right angle  retractor was then placed and the transverse carpal ligament was divided under direct visualization distally and then proximally to the forearm fascia.  The proximal dissection was assisted with a sore retractor. No masses were present within the carpal  canal.  Motor branch intact.  Thorough irrigation was performed.  Skin edges were anesthetized.  Tourniquet released.  Bleeding points encountered were controlled using bipolar electrocautery.  Incision then closed using simple 3-0  nylon sutures.   Impervious dressing plus Ace wrap applied.  Luke's assistance was required at all times for opening, closing, retraction, mobilization of tissue.  His assistance was a medical necessity.   MUK D: 04/09/2022 8:24:30 am T: 04/09/2022 8:32:00 am  JOB: [id number]/ [id number]

## 2022-04-09 NOTE — Discharge Instructions (Signed)

## 2022-04-10 ENCOUNTER — Encounter (HOSPITAL_BASED_OUTPATIENT_CLINIC_OR_DEPARTMENT_OTHER): Payer: Self-pay | Admitting: Orthopedic Surgery

## 2022-04-14 DIAGNOSIS — G5602 Carpal tunnel syndrome, left upper limb: Secondary | ICD-10-CM

## 2022-04-23 ENCOUNTER — Encounter: Payer: Self-pay | Admitting: Orthopedic Surgery

## 2022-04-23 ENCOUNTER — Ambulatory Visit (INDEPENDENT_AMBULATORY_CARE_PROVIDER_SITE_OTHER): Payer: BC Managed Care – PPO | Admitting: Surgical

## 2022-04-23 DIAGNOSIS — G5602 Carpal tunnel syndrome, left upper limb: Secondary | ICD-10-CM

## 2022-04-23 NOTE — Progress Notes (Signed)
Post-Op Visit Note   Patient: Joy Alvarado Self           Date of Birth: February 26, 1961           MRN: [id number] Visit Date: 04/23/2022 PCP: Redmond School, MD   Assessment & Plan:  Chief Complaint:  Chief Complaint  Patient presents with   Left Hand - Routine Post Op    04/09/22 (2w) Left Carpal Tunnel Release    Visit Diagnoses: No diagnosis found.  Plan: Joy Alvarado is a 61 year old female who presents s/p left carpal tunnel release on 04/09/2022.  Doing well overall and states that she has minimal pain.  Pain is actually better than it was prior to surgery.  She is taking less medication for pain control.  Tingling is improved and the "drawing up" feeling that she had in her fingers has resolved.  She continues to have some tingling in the tips of the thumb, index, long, ring fingers but feels this is improving as well.  She denies any fevers, chills, drainage that she has noticed from the incision.  On exam, patient has incision that is healing well without evidence of infection or dehiscence.  Sutures are intact.  These were removed and replaced with Steri-Strips.  There is no dehiscence of the incision after removal of the sutures.  2+ radial pulse of the operative extremity.  Intact EPL, FPL, finger abduction.  Plan is to continue with no lifting.  She may start range of motion of the wrist and fingers.  Follow-up in 4 weeks for clinical recheck.  She will call with any concerns in the meantime.  Plan to return to work at that point when she returns as long as the incision is well-healed.  Cautioned patient against submersion underwater such as in a sink, pool, hot tub, bath.  She may shower.  Follow-Up Instructions: No follow-ups on file.   Orders:  No orders of the defined types were placed in this encounter.  No orders of the defined types were placed in this encounter.   Imaging: No results found.  PMFS History: Patient Active Problem List   Diagnosis Date Noted   Carpal  tunnel syndrome, left upper limb 04/14/2022   Porokeratosis 04/18/2015   Past Medical History:  Diagnosis Date   Former smoker    Hypertension    Left carpal tunnel syndrome    left   PONV (postoperative nausea and vomiting)     No family history on file.  Past Surgical History:  Procedure Laterality Date   APPENDECTOMY     CARPAL TUNNEL RELEASE Left 04/09/2022   Procedure: LEFT CARPAL TUNNEL RELEASE;  Surgeon: Meredith Pel, MD;  Location: [facility];  Service: Orthopedics;  Laterality: Left;   CESAREAN SECTION     COLONOSCOPY N/A 12/04/2019   Procedure: COLONOSCOPY;  Surgeon: Daneil Dolin, MD;  [facility];  Service: Endoscopy;  Laterality: N/A;  12:00   FOOT SURGERY     POLYPECTOMY  12/04/2019   Procedure: POLYPECTOMY;  Surgeon: Daneil Dolin, MD;  [facility];  Service: Endoscopy;;   TUBAL LIGATION     Social History   Occupational History   Not on file  Tobacco Use   Smoking status: Former    Packs/day: 1.00    Types: Cigarettes    Quit date: 03/2021    Years since quitting: 1.1   Smokeless tobacco: Never  Vaping Use   Vaping Use: Never used  Substance and  Sexual Activity   Alcohol use: Yes    Alcohol/week: 0.0 standard drinks of alcohol    Comment: glass wine daily   Drug use: Never   Sexual activity: Yes    Birth control/protection: Post-menopausal, Surgical    Comment: BTL

## 2022-05-21 ENCOUNTER — Ambulatory Visit (INDEPENDENT_AMBULATORY_CARE_PROVIDER_SITE_OTHER): Payer: BC Managed Care – PPO | Admitting: Orthopedic Surgery

## 2022-05-21 DIAGNOSIS — G5602 Carpal tunnel syndrome, left upper limb: Secondary | ICD-10-CM

## 2022-05-23 NOTE — Progress Notes (Unsigned)
   Post-Op Visit Note   Patient: Joy Alvarado           Date of Birth: Mar 30, 1961           MRN: [id number] Visit Date: 05/21/2022 PCP: Redmond School, MD   Assessment & Plan:  Chief Complaint:  Chief Complaint  Patient presents with   Follow-up    L CTR (surgery date 04-09-22)   Visit Diagnoses:  1. Carpal tunnel syndrome, left upper limb     Plan: Joy Alvarado is a 61 year old patient who underwent left carpal tunnel release 04/09/2022.  Overall doing reasonably well.  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  Currently on short-term disability.  On examination she has good strength and the incision is intact.  I do like she is quite ready to return to the vigorous lifting of 40 or 50 pounds which she has to do.  I think that she would be okay to try that next Monday for 1 week and then she will have a week off.  She will let me know if there is any difficulties with this return to work schedule.  Overall she is making progress and she is very close to being ready for full return to work.  Anticipate that will happen sometime next week.  Follow-Up Instructions: No follow-ups on file.   Orders:  No orders of the defined types were placed in this encounter.  No orders of the defined types were placed in this encounter.   Imaging: No results found.  PMFS History: Patient Active Problem List   Diagnosis Date Noted   Carpal tunnel syndrome, left upper limb 04/14/2022   Porokeratosis 04/18/2015   Past Medical History:  Diagnosis Date   Former smoker    Hypertension    Left carpal tunnel syndrome    left   PONV (postoperative nausea and vomiting)     No family history on file.  Past Surgical History:  Procedure Laterality Date   APPENDECTOMY     CARPAL TUNNEL RELEASE Left 04/09/2022   Procedure: LEFT CARPAL TUNNEL RELEASE;  Surgeon: Meredith Pel, MD;  Location: [facility];  Service: Orthopedics;  Laterality: Left;    CESAREAN SECTION     COLONOSCOPY N/A 12/04/2019   Procedure: COLONOSCOPY;  Surgeon: Daneil Dolin, MD;  [facility];  Service: Endoscopy;  Laterality: N/A;  12:00   FOOT SURGERY     POLYPECTOMY  12/04/2019   Procedure: POLYPECTOMY;  Surgeon: Daneil Dolin, MD;  [facility];  Service: Endoscopy;;   TUBAL LIGATION     Social History   Occupational History   Not on file  Tobacco Use   Smoking status: Former    Packs/day: 1.00    Types: Cigarettes    Quit date: 03/2021    Years since quitting: 1.2   Smokeless tobacco: Never  Vaping Use   Vaping Use: Never used  Substance and Sexual Activity   Alcohol use: Yes    Alcohol/week: 0.0 standard drinks of alcohol    Comment: glass wine daily   Drug use: Never   Sexual activity: Yes    Birth control/protection: Post-menopausal, Surgical    Comment: BTL

## 2022-05-24 ENCOUNTER — Encounter: Payer: Self-pay | Admitting: Orthopedic Surgery

## 2022-07-17 ENCOUNTER — Other Ambulatory Visit (HOSPITAL_COMMUNITY): Payer: Self-pay | Admitting: Internal Medicine

## 2022-07-17 DIAGNOSIS — Z1231 Encounter for screening mammogram for malignant neoplasm of breast: Secondary | ICD-10-CM

## 2022-07-23 ENCOUNTER — Ambulatory Visit (INDEPENDENT_AMBULATORY_CARE_PROVIDER_SITE_OTHER): Payer: BC Managed Care – PPO

## 2022-07-23 ENCOUNTER — Ambulatory Visit: Payer: BC Managed Care – PPO | Admitting: Podiatry

## 2022-07-23 DIAGNOSIS — M778 Other enthesopathies, not elsewhere classified: Secondary | ICD-10-CM | POA: Diagnosis not present

## 2022-07-23 DIAGNOSIS — M79671 Pain in right foot: Secondary | ICD-10-CM | POA: Diagnosis not present

## 2022-07-23 DIAGNOSIS — R52 Pain, unspecified: Secondary | ICD-10-CM | POA: Diagnosis not present

## 2022-07-23 MED ORDER — MELOXICAM 15 MG PO TABS: 15.0000 mg | ORAL_TABLET | Freq: Every day | ORAL | 1 refills | Status: DC

## 2022-07-23 MED ORDER — BETAMETHASONE SOD PHOS & ACET 6 (3-3) MG/ML IJ SUSP
3.0000 mg | Freq: Once | INTRAMUSCULAR | Status: AC
  Administered 2022-07-23: 3 mg via INTRA_ARTICULAR

## 2022-07-23 NOTE — Progress Notes (Signed)
   Chief Complaint  Patient presents with   Fracture    Right  foot swollen possible fracture, started having pain 3 weeks ago, X-rays taken today     HPI: 62 y.o. female presenting today for new complaint of pain and tenderness associated to the right midfoot.  Patient states that about 3 weeks ago she began to experience pain and tenderness to the right midfoot.  She has noticed swelling throughout the day.  She is not anything currently for treatment.  She denies a history of injury.  Past Medical History:  Diagnosis Date   Former smoker    Hypertension    Left carpal tunnel syndrome    left   PONV (postoperative nausea and vomiting)     Past Surgical History:  Procedure Laterality Date   APPENDECTOMY     CARPAL TUNNEL RELEASE Left 04/09/2022   Procedure: LEFT CARPAL TUNNEL RELEASE;  Surgeon: Meredith Pel, MD;  Location: [place];  Service: Orthopedics;  Laterality: Left;   CESAREAN SECTION     COLONOSCOPY N/A 12/04/2019   Procedure: COLONOSCOPY;  Surgeon: Daneil Dolin, MD;  [facility];  Service: Endoscopy;  Laterality: N/A;  12:00   FOOT SURGERY     POLYPECTOMY  12/04/2019   Procedure: POLYPECTOMY;  Surgeon: Daneil Dolin, MD;  [facility]: [facility];  Service: Endoscopy;;   TUBAL LIGATION      No Known Allergies   Physical Exam: General: The patient is alert and oriented x3 in no acute distress.  Dermatology: Skin is warm, dry and supple bilateral lower extremities. Negative for open lesions or macerations.  Vascular: Palpable pedal pulses bilaterally. Capillary refill within normal limits.  Negative for any significant edema or erythema  Neurological: Light touch and protective threshold grossly intact  Musculoskeletal Exam: No pedal deformities noted.  There is tenderness to palpation throughout the midfoot right  Radiographic Exam RT foot 07/23/2022:  Normal osseous mineralization.  There does appear to be some degenerative  changes noted throughout the midfoot and midtarsal joints radiographically.  No acute fractures identified.  Assessment/plan of care: 1.  Capsulitis right midfoot -Patient evaluated.  X-rays reviewed -Injection of 0.5 cc Celestone Soluspan injected into the right midfoot -Prescription for meloxicam 15 mg daily -Cam boot dispensed.  WBAT x 4 weeks.  Patient states that she is unable to wear the cam boot while she is working.  Compression ankle sleeve dispensed to wear during the day with good supportive tennis shoes -Return to clinic 4 weeks     Edrick Kins, DPM [facility]  Dr. Edrick Kins, DPM    [address], [place] [address]                Office [phone]  Fax [phone]

## 2022-08-08 ENCOUNTER — Other Ambulatory Visit: Payer: Self-pay | Admitting: Podiatry

## 2022-08-08 DIAGNOSIS — R52 Pain, unspecified: Secondary | ICD-10-CM

## 2022-08-08 DIAGNOSIS — M79671 Pain in right foot: Secondary | ICD-10-CM

## 2022-08-08 DIAGNOSIS — M778 Other enthesopathies, not elsewhere classified: Secondary | ICD-10-CM

## 2022-08-13 ENCOUNTER — Ambulatory Visit (HOSPITAL_COMMUNITY)
Admission: RE | Admit: 2022-08-13 | Discharge: 2022-08-13 | Disposition: A | Discharge status: Home / Self Care | Payer: BC Managed Care – PPO | Source: Ambulatory Visit | Attending: Internal Medicine | Admitting: Internal Medicine

## 2022-08-13 ENCOUNTER — Encounter (HOSPITAL_COMMUNITY): Payer: Self-pay

## 2022-08-13 DIAGNOSIS — Z1231 Encounter for screening mammogram for malignant neoplasm of breast: Secondary | ICD-10-CM | POA: Diagnosis not present

## 2022-08-15 DIAGNOSIS — Z6827 Body mass index (BMI) 27.0-27.9, adult: Secondary | ICD-10-CM | POA: Diagnosis not present

## 2022-08-15 DIAGNOSIS — E559 Vitamin D deficiency, unspecified: Secondary | ICD-10-CM | POA: Diagnosis not present

## 2022-08-15 DIAGNOSIS — Z1331 Encounter for screening for depression: Secondary | ICD-10-CM | POA: Diagnosis not present

## 2022-08-15 DIAGNOSIS — Z1231 Encounter for screening mammogram for malignant neoplasm of breast: Secondary | ICD-10-CM | POA: Diagnosis not present

## 2022-08-15 DIAGNOSIS — Z0001 Encounter for general adult medical examination with abnormal findings: Secondary | ICD-10-CM | POA: Diagnosis not present

## 2022-08-15 DIAGNOSIS — I10 Essential (primary) hypertension: Secondary | ICD-10-CM | POA: Diagnosis not present

## 2022-08-15 DIAGNOSIS — E538 Deficiency of other specified B group vitamins: Secondary | ICD-10-CM | POA: Diagnosis not present

## 2022-08-15 DIAGNOSIS — E663 Overweight: Secondary | ICD-10-CM | POA: Diagnosis not present

## 2022-08-20 ENCOUNTER — Ambulatory Visit: Payer: BC Managed Care – PPO | Admitting: Podiatry

## 2022-08-20 ENCOUNTER — Ambulatory Visit (INDEPENDENT_AMBULATORY_CARE_PROVIDER_SITE_OTHER): Payer: BC Managed Care – PPO

## 2022-08-20 DIAGNOSIS — M778 Other enthesopathies, not elsewhere classified: Secondary | ICD-10-CM

## 2022-08-20 MED ORDER — BETAMETHASONE SOD PHOS & ACET 6 (3-3) MG/ML IJ SUSP
3.0000 mg | Freq: Once | INTRAMUSCULAR | Status: AC
  Administered 2022-08-20: 3 mg via INTRA_ARTICULAR

## 2022-08-20 MED ORDER — MELOXICAM 15 MG PO TABS: 15.0000 mg | ORAL_TABLET | Freq: Every day | ORAL | 1 refills | Status: AC

## 2022-08-20 NOTE — Progress Notes (Signed)
   Chief Complaint  Patient presents with   Foot Pain    Right foot pain, rate of pain 6 out of 10, TX: compression, X-rays done today,     HPI: 62 y.o. female presenting today for follow-up evaluation of pain and tenderness associated to the right midfoot.  Patient states that the injection did help for about 2 weeks.  Slowly the pain returned.  She continues to have pain and tenderness associated to the right midfoot.  Past Medical History:  Diagnosis Date   Former smoker    Hypertension    Left carpal tunnel syndrome    left   PONV (postoperative nausea and vomiting)     Past Surgical History:  Procedure Laterality Date   APPENDECTOMY     CARPAL TUNNEL RELEASE Left 04/09/2022   Procedure: LEFT CARPAL TUNNEL RELEASE;  Surgeon: Meredith Pel, MD;  Location: [place];  Service: Orthopedics;  Laterality: Left;   CESAREAN SECTION     COLONOSCOPY N/A 12/04/2019   Procedure: COLONOSCOPY;  Surgeon: Daneil Dolin, MD;  [facility];  Service: Endoscopy;  Laterality: N/A;  12:00   FOOT SURGERY     POLYPECTOMY  12/04/2019   Procedure: POLYPECTOMY;  Surgeon: Daneil Dolin, MD;  [facility];  Service: Endoscopy;;   TUBAL LIGATION      No Known Allergies   Physical Exam: General: The patient is alert and oriented x3 in no acute distress.  Dermatology: Skin is warm, dry and supple bilateral lower extremities. Negative for open lesions or macerations.  Vascular: Palpable pedal pulses bilaterally. Capillary refill within normal limits.  Negative for any significant edema or erythema  Neurological: Light touch and protective threshold grossly intact  Musculoskeletal Exam: No pedal deformities noted.  There is tenderness to palpation throughout the midfoot right  Radiographic Exam RT foot 07/23/2022:  Normal osseous mineralization.  There does appear to be some degenerative changes noted throughout the midfoot and midtarsal joints  radiographically.  No acute fractures identified.  Assessment/plan of care: 1.  Capsulitis right midfoot -Patient evaluated.  -Injection of 0.5 cc Celestone Soluspan injected into the right midfoot - Continue meloxicam 15 mg daily.  Refill provided - The patient has not had significant improvement or resolution of her symptoms despite concern of care.  MRI ordered right foot without contrast -Will plan to discuss results of the MRI with the patient via telephone and to discuss further treatment options for the patient     Edrick Kins, DPM [facility]  Dr. Edrick Kins, DPM    [address], [place] [address]                Office [phone]  Fax [phone]

## 2022-09-08 ENCOUNTER — Other Ambulatory Visit: Payer: BC Managed Care – PPO

## 2022-09-20 ENCOUNTER — Encounter: Payer: Self-pay | Admitting: Podiatry

## 2022-09-22 ENCOUNTER — Ambulatory Visit
Admission: RE | Admit: 2022-09-22 | Discharge: 2022-09-22 | Disposition: A | Discharge status: Home / Self Care | Payer: BC Managed Care – PPO | Source: Ambulatory Visit | Attending: Podiatry | Admitting: Podiatry

## 2022-09-22 DIAGNOSIS — R600 Localized edema: Secondary | ICD-10-CM | POA: Diagnosis not present

## 2022-09-22 DIAGNOSIS — M778 Other enthesopathies, not elsewhere classified: Secondary | ICD-10-CM

## 2022-09-22 DIAGNOSIS — Z9889 Other specified postprocedural states: Secondary | ICD-10-CM | POA: Diagnosis not present

## 2022-09-22 DIAGNOSIS — M19071 Primary osteoarthritis, right ankle and foot: Secondary | ICD-10-CM | POA: Diagnosis not present

## 2022-10-10 ENCOUNTER — Ambulatory Visit: Payer: BC Managed Care – PPO | Admitting: Podiatry

## 2022-10-10 DIAGNOSIS — M19071 Primary osteoarthritis, right ankle and foot: Secondary | ICD-10-CM | POA: Diagnosis not present

## 2022-10-10 NOTE — Progress Notes (Signed)
Chief Complaint  Patient presents with   Foot Pain    Patient came in today for right foot pain follow-up, rate of pain 5 out of 10, MRI results     HPI: 62 y.o. female presenting today for follow-up evaluation of pain and tenderness associated to the right midfoot.  Patient continues to have pain and tenderness on a daily basis.  She says after she begins walking for a while the pain slowly decreases in the foot loosens up.  Last visit on 08/20/2022 MRI was ordered.  Presenting to review the MRI results today  Past Medical History:  Diagnosis Date   Former smoker    Hypertension    Left carpal tunnel syndrome    left   PONV (postoperative nausea and vomiting)     Past Surgical History:  Procedure Laterality Date   APPENDECTOMY     CARPAL TUNNEL RELEASE Left 04/09/2022   Procedure: LEFT CARPAL TUNNEL RELEASE;  Surgeon: Cammy Copa, MD;  Location: [facility];  Service: Orthopedics;  Laterality: Left;   CESAREAN SECTION     COLONOSCOPY N/A 12/04/2019   Procedure: COLONOSCOPY;  Surgeon: Corbin Ade, MD;  [facility];  Service: Endoscopy;  Laterality: N/A;  12:00   FOOT SURGERY     POLYPECTOMY  12/04/2019   Procedure: POLYPECTOMY;  Surgeon: Corbin Ade, MD;  [facility];  Service: Endoscopy;;   TUBAL LIGATION      No Known Allergies   Physical Exam: General: The patient is alert and oriented x3 in no acute distress.  Dermatology: Skin is warm, dry and supple bilateral lower extremities. Negative for open lesions or macerations.  Vascular: Palpable pedal pulses bilaterally. Capillary refill within normal limits.  Negative for any significant edema or erythema  Neurological: Light touch and protective threshold grossly intact  Musculoskeletal Exam: No pedal deformities noted.  There is tenderness to palpation throughout the midfoot right  Radiographic Exam RT foot 07/23/2022:  Normal osseous mineralization.  There does  appear to be some degenerative changes noted throughout the midfoot and midtarsal joints radiographically.  No acute fractures identified.  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   No evidence of stress reaction or stress fracture.   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  Capsulitis/DJD right midfoot; third and fourth TMT -Patient evaluated.  MRI results reviewed today - Continue meloxicam 15 mg daily.  Refill provided - Today we did discuss possible midfoot arthrodesis to alleviate the pain and tenderness associated to the arthritic condition of the foot.  Radiographically and clinically the fourth TMT does not seem to be as symptomatic as the third.  Surgery would likely consist of second and third TMT arthrodesis.  Clinically the patient continues to have some pain and tenderness associated to the second TMT as well as the third.  I believe that arthrodesis of both the second and third would also help alleviate the patient's symptoms better than an isolated third TMT -This procedure was explained in detail to the patient and her husband, Ray who is present today.  Risk benefits advantages and disadvantages were explained in detail to the patient.  Postoperative recovery course was also explained.  She understands that she will be nonweightbearing for about 4-6 weeks postoperatively. -Patient will consider surgery.  In the meantime recommend good supportive shoes and sneakers.  Advised against going barefoot. -Return to clinic as needed     Felecia Shelling, DPM [facility]  Dr. Felecia Shelling, DPM    [address] [place], [place] [address]                Office [phone]  Fax [phone]

## 2023-08-13 ENCOUNTER — Other Ambulatory Visit (HOSPITAL_COMMUNITY): Payer: Self-pay | Admitting: Internal Medicine

## 2023-08-13 DIAGNOSIS — Z1231 Encounter for screening mammogram for malignant neoplasm of breast: Secondary | ICD-10-CM

## 2023-08-14 ENCOUNTER — Encounter (HOSPITAL_COMMUNITY): Payer: Self-pay

## 2023-08-14 ENCOUNTER — Ambulatory Visit (HOSPITAL_COMMUNITY)
Admission: RE | Admit: 2023-08-14 | Discharge: 2023-08-14 | Disposition: A | Discharge status: Home / Self Care | Source: Ambulatory Visit | Attending: Internal Medicine | Admitting: Internal Medicine

## 2023-08-14 DIAGNOSIS — T50905A Adverse effect of unspecified drugs, medicaments and biological substances, initial encounter: Secondary | ICD-10-CM | POA: Diagnosis not present

## 2023-08-14 DIAGNOSIS — F5101 Primary insomnia: Secondary | ICD-10-CM | POA: Diagnosis not present

## 2023-08-14 DIAGNOSIS — Z1231 Encounter for screening mammogram for malignant neoplasm of breast: Secondary | ICD-10-CM | POA: Insufficient documentation

## 2023-08-14 DIAGNOSIS — I10 Essential (primary) hypertension: Secondary | ICD-10-CM | POA: Diagnosis not present

## 2023-08-14 DIAGNOSIS — Z6824 Body mass index (BMI) 24.0-24.9, adult: Secondary | ICD-10-CM | POA: Diagnosis not present

## 2023-08-14 DIAGNOSIS — E538 Deficiency of other specified B group vitamins: Secondary | ICD-10-CM | POA: Diagnosis not present

## 2023-08-14 DIAGNOSIS — M67912 Unspecified disorder of synovium and tendon, left shoulder: Secondary | ICD-10-CM | POA: Diagnosis not present

## 2023-08-14 DIAGNOSIS — E559 Vitamin D deficiency, unspecified: Secondary | ICD-10-CM | POA: Diagnosis not present

## 2023-08-14 DIAGNOSIS — Z1331 Encounter for screening for depression: Secondary | ICD-10-CM | POA: Diagnosis not present

## 2023-08-14 DIAGNOSIS — Z0001 Encounter for general adult medical examination with abnormal findings: Secondary | ICD-10-CM | POA: Diagnosis not present

## 2023-10-30 DIAGNOSIS — H25813 Combined forms of age-related cataract, bilateral: Secondary | ICD-10-CM | POA: Diagnosis not present

## 2023-12-25 IMAGING — MG MM DIGITAL SCREENING BILAT W/ TOMO AND CAD
8 series · 8 of 24 positions shown · non-contrast
Comparison: Previous exam(s).

CLINICAL DATA: Screening.

EXAM:
DIGITAL SCREENING BILATERAL MAMMOGRAM WITH TOMOSYNTHESIS AND CAD
TECHNIQUE: Bilateral screening digital craniocaudal and mediolateral oblique
mammograms were obtained. Bilateral screening digital breast
tomosynthesis was performed. The images were evaluated with
computer-aided detection.

[R MLO synth-2D]
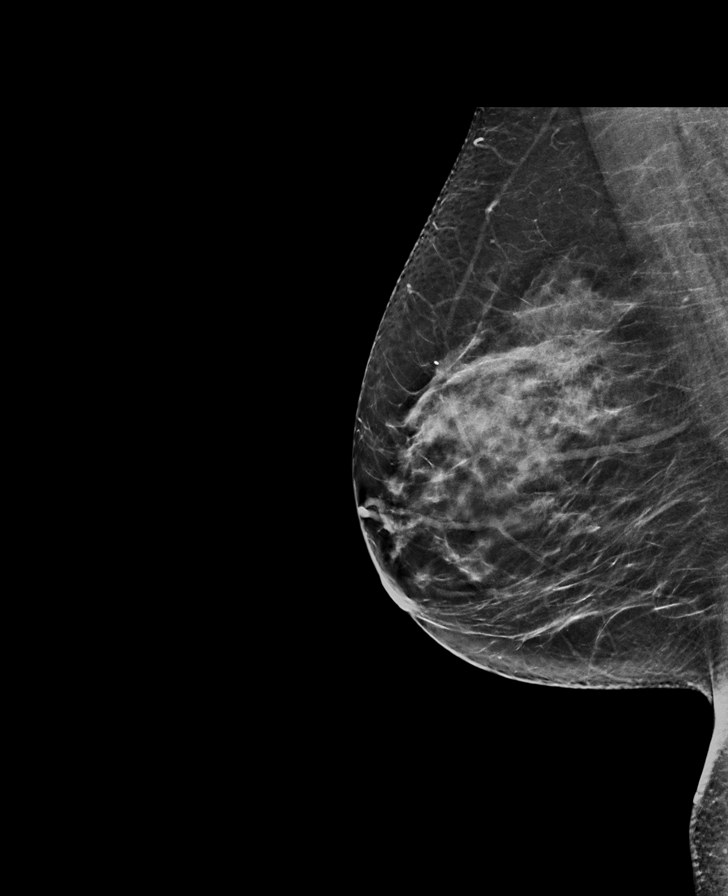

[L MLO synth-2D]
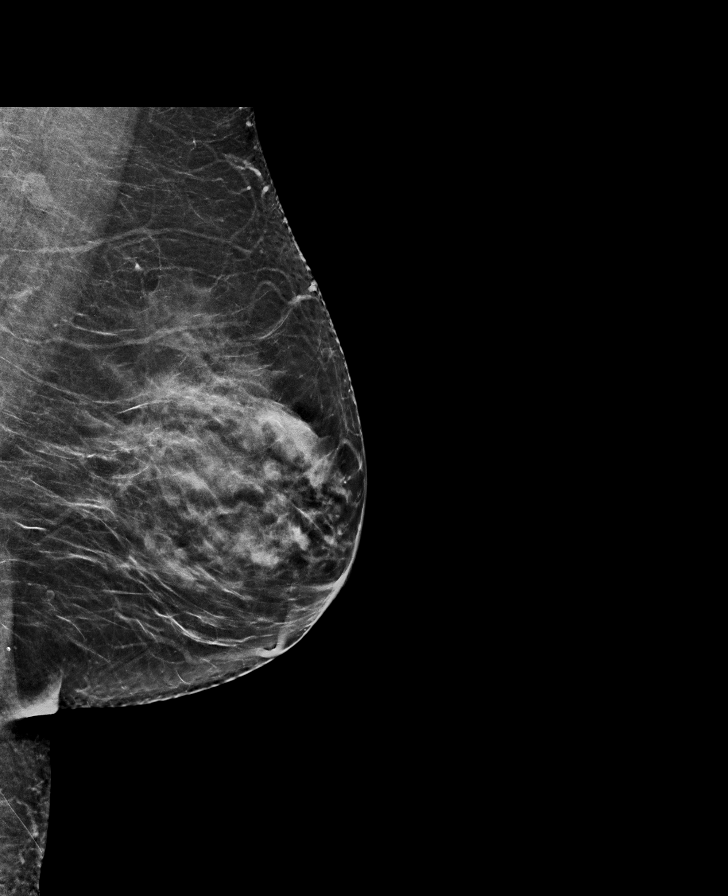

[L CC synth-2D]
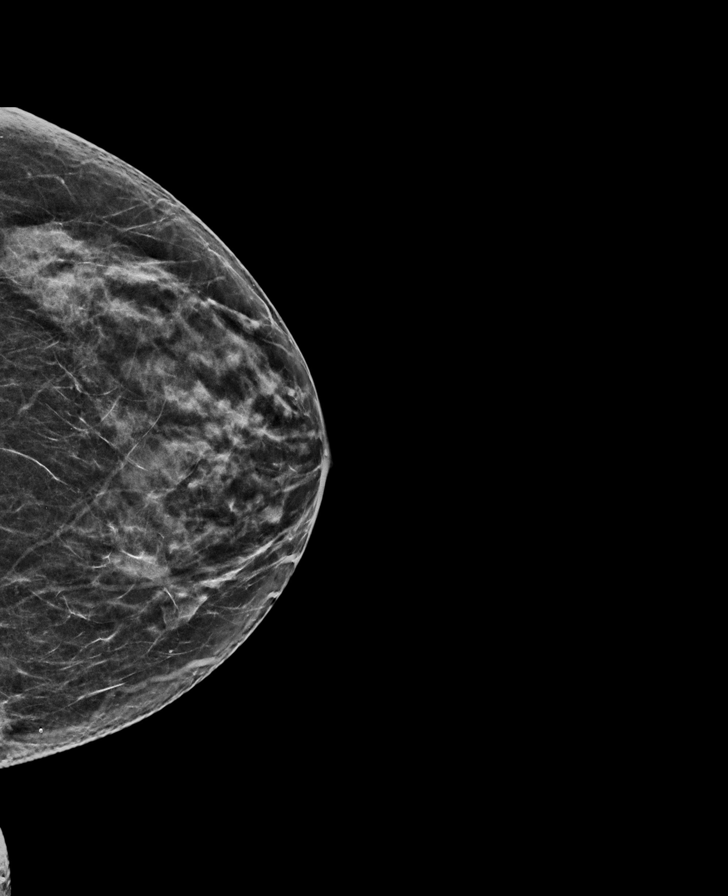

[R CC synth-2D]
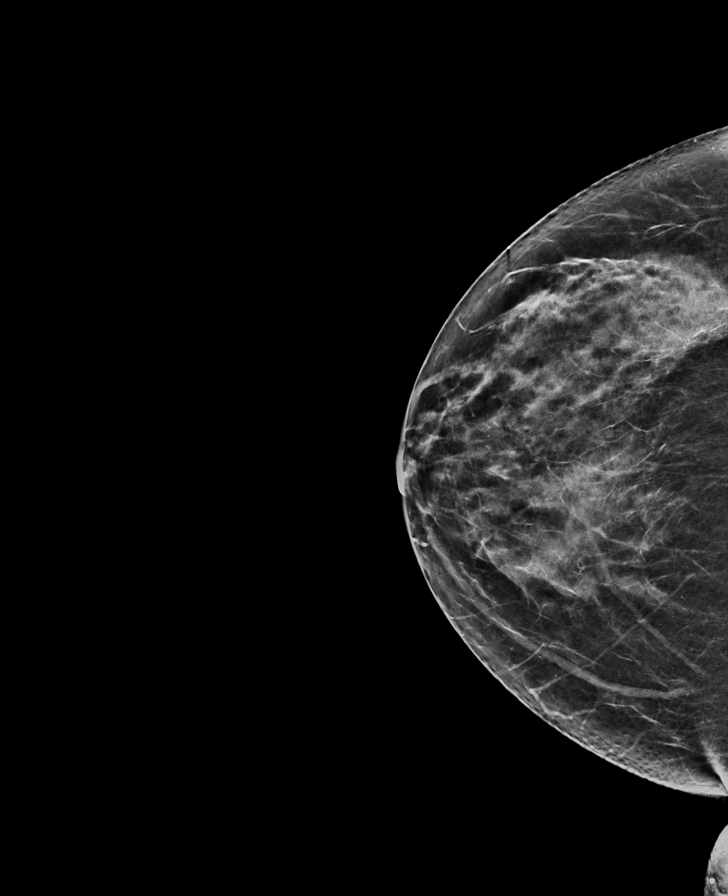

[L MLO tomo · tomo slice 35/69.0]
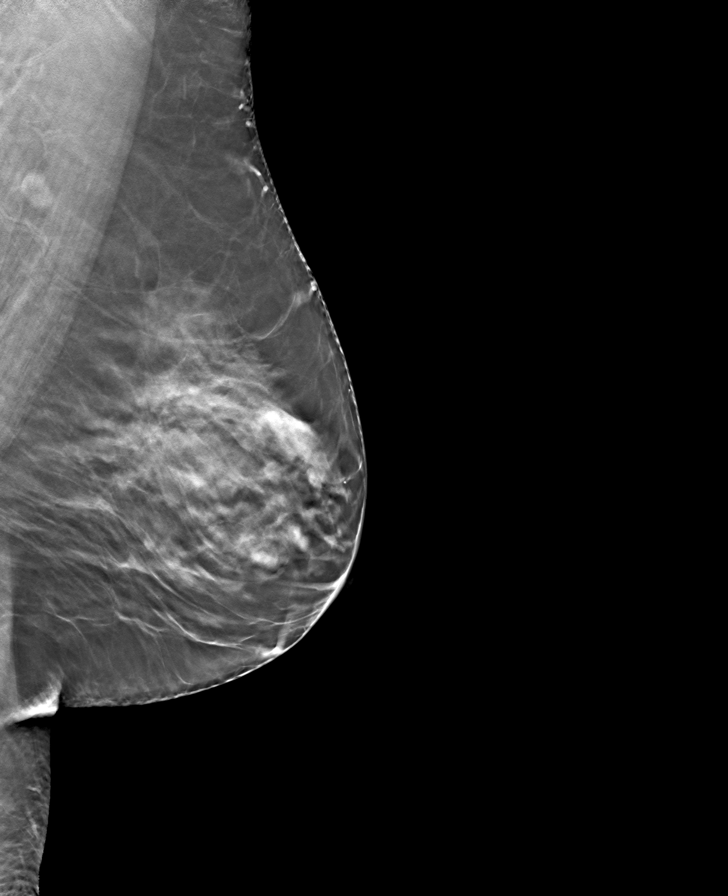

[L CC tomo · tomo slice 30/59.0]
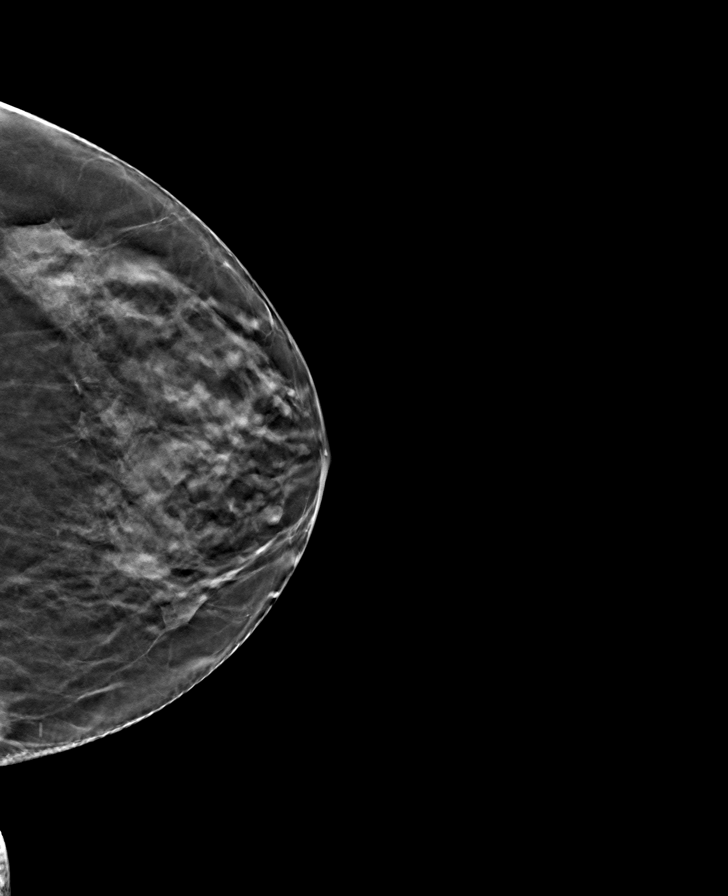

[R MLO tomo · tomo slice 34/67.0]
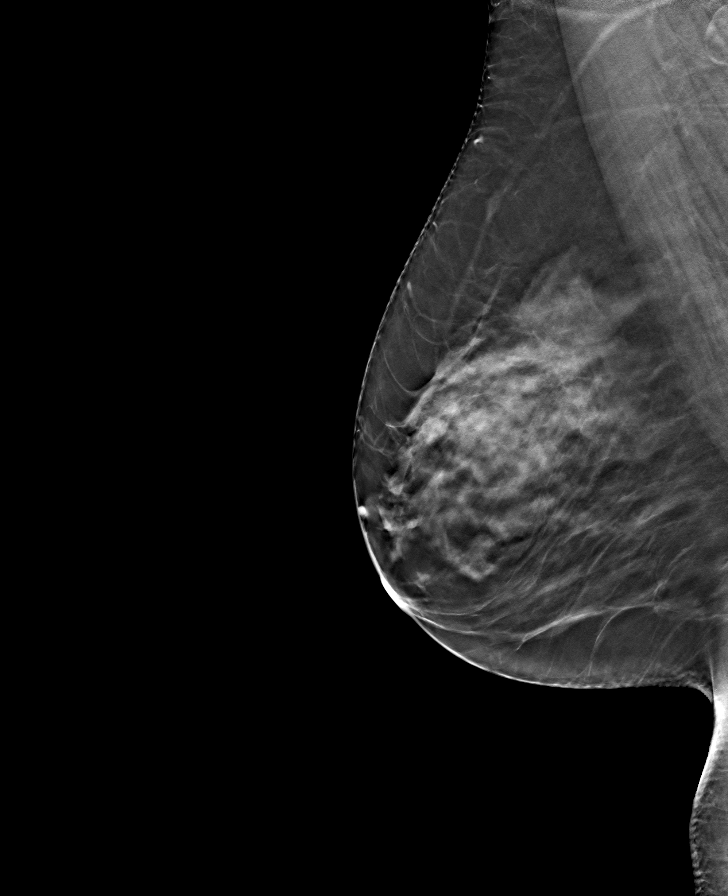

[R CC tomo · tomo slice 31/60.0]
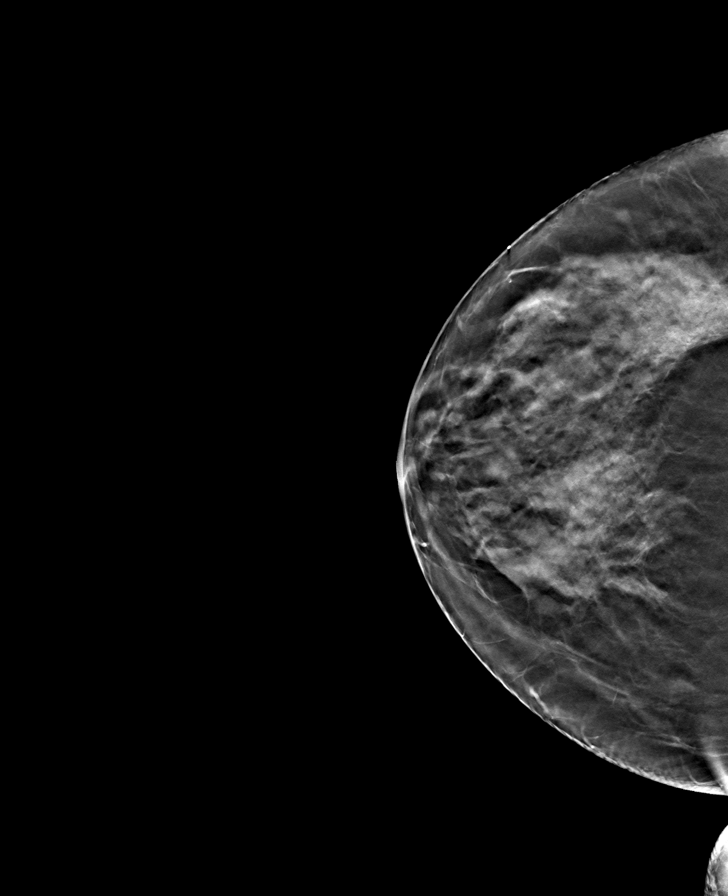

[8 of 24 positions shown; findings below may reference images not displayed]

ACR Breast Density Category c: The breast tissue is heterogeneously
dense, which may obscure small masses.
FINDINGS: There are no findings suspicious for malignancy.
IMPRESSION: No mammographic evidence of malignancy. A result letter of this
screening mammogram will be mailed directly to the patient.

RECOMMENDATION:
Screening mammogram in one year. (Code:Q3-W-BC3)

BI-RADS CATEGORY  1: Negative.

## 2024-01-17 DIAGNOSIS — Z6825 Body mass index (BMI) 25.0-25.9, adult: Secondary | ICD-10-CM | POA: Diagnosis not present

## 2024-01-17 DIAGNOSIS — E663 Overweight: Secondary | ICD-10-CM | POA: Diagnosis not present

## 2024-01-17 DIAGNOSIS — I10 Essential (primary) hypertension: Secondary | ICD-10-CM | POA: Diagnosis not present

## 2024-01-17 DIAGNOSIS — N390 Urinary tract infection, site not specified: Secondary | ICD-10-CM | POA: Diagnosis not present

## 2024-01-17 DIAGNOSIS — F5101 Primary insomnia: Secondary | ICD-10-CM | POA: Diagnosis not present

## 2024-01-20 DIAGNOSIS — I10 Essential (primary) hypertension: Secondary | ICD-10-CM | POA: Diagnosis not present

## 2024-02-21 DIAGNOSIS — I872 Venous insufficiency (chronic) (peripheral): Secondary | ICD-10-CM | POA: Diagnosis not present

## 2024-02-21 DIAGNOSIS — D2239 Melanocytic nevi of other parts of face: Secondary | ICD-10-CM | POA: Diagnosis not present

## 2024-02-21 DIAGNOSIS — Z1283 Encounter for screening for malignant neoplasm of skin: Secondary | ICD-10-CM | POA: Diagnosis not present

## 2024-02-21 DIAGNOSIS — D225 Melanocytic nevi of trunk: Secondary | ICD-10-CM | POA: Diagnosis not present

## 2024-02-21 DIAGNOSIS — L820 Inflamed seborrheic keratosis: Secondary | ICD-10-CM | POA: Diagnosis not present

## 2024-04-08 ENCOUNTER — Telehealth: Payer: Self-pay

## 2024-04-08 ENCOUNTER — Telehealth: Payer: Self-pay | Admitting: Internal Medicine

## 2024-04-08 NOTE — Telephone Encounter (Signed)
 Disregard

## 2024-04-08 NOTE — Telephone Encounter (Signed)
 Okay to schedule a new patient visit for January, Mr. Hammes was aware that I will not be available in the next few weeks.

## 2024-04-08 NOTE — Telephone Encounter (Signed)
 Okay to schedule NP appt per Dr. Amon. Please schedule for sometime in January 2026, make Pt aware that Dr. Amon will be out for 6 weeks starting Monday, 04/13/24. Thank you.

## 2024-04-08 NOTE — Telephone Encounter (Signed)
 Copied from CRM [id number]. Topic: Appointments - Scheduling Inquiry for Clinic >> Apr 08, 2024 11:52 AM Alfonso ORN wrote: Reason for CRM: Dr. Aloysius Mech told pt's husband that Mech will take Joy Alvarado as a new pt. Please contact to confirm and schedule.   Husband# [phone]

## 2024-04-09 NOTE — Telephone Encounter (Signed)
 Please inform Pt we will be unable to prescribe/refill meds until she has established w/ Dr. Amon.

## 2024-04-09 NOTE — Telephone Encounter (Signed)
 Joy Alvarado to Me (Selected Message) BN    04/08/24  4:53 PM Pt is scheduled for 07/24/24 pt mentioned she will run out of bp medicine prior to appt and asked if Dr. Amon would be able to refill it. I let the pt know since she was a new pt we may not be able to but I told her I would ask and we could giver her a call back.

## 2024-05-08 DIAGNOSIS — Z299 Encounter for prophylactic measures, unspecified: Secondary | ICD-10-CM | POA: Diagnosis not present

## 2024-05-08 DIAGNOSIS — G2581 Restless legs syndrome: Secondary | ICD-10-CM | POA: Diagnosis not present

## 2024-05-08 DIAGNOSIS — G4700 Insomnia, unspecified: Secondary | ICD-10-CM | POA: Diagnosis not present

## 2024-05-08 DIAGNOSIS — I10 Essential (primary) hypertension: Secondary | ICD-10-CM | POA: Diagnosis not present

## 2024-07-24 ENCOUNTER — Ambulatory Visit: Admitting: Internal Medicine
